# Patient Record
Sex: Female | Born: 1990 | Race: White | Hispanic: No | Marital: Single | Smoking: Never smoker
Health system: Southern US, Community
[De-identification: ages and names within clinical notes are randomized; demographics above are authoritative.]

## PROBLEM LIST (undated history)

## (undated) DIAGNOSIS — R519 Headache, unspecified: Secondary | ICD-10-CM

## (undated) DIAGNOSIS — Z8619 Personal history of other infectious and parasitic diseases: Secondary | ICD-10-CM

## (undated) DIAGNOSIS — R51 Headache: Secondary | ICD-10-CM

## (undated) HISTORY — DX: Personal history of other infectious and parasitic diseases: Z86.19

## (undated) HISTORY — DX: Headache, unspecified: R51.9

## (undated) HISTORY — PX: APPENDECTOMY: SHX54

## (undated) HISTORY — DX: Headache: R51

## (undated) HISTORY — PX: TONSILLECTOMY: SUR1361

---
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
## 2014-10-31 NOTE — L&D Delivery Note (Signed)
Delivery Note Pt reached complete dilation and pushed well for about one hour.  At 7:02 PM a healthy female was delivered via Vaginal, Spontaneous Delivery (Presentation: Left Occiput Anterior).  APGAR: 9, 9; weight  pending .   Placenta status: Intact, Spontaneous.  Cord: 3 vessels with the following complications: None.  Anesthesia: Epidural  Episiotomy: None Lacerations: 2nd degree Suture Repair: 3.0 vicryl rapide Est. Blood Loss (mL):  Mom to postpartum.  Baby to Couplet care / Skin to Skin. Pt plans circ in office  RICHARDSON,KATHY W 07/30/2015, 7:34 PM

## 2015-03-04 LAB — OB RESULTS CONSOLE HIV ANTIBODY (ROUTINE TESTING): HIV: NONREACTIVE

## 2015-03-04 LAB — OB RESULTS CONSOLE RPR: RPR: NONREACTIVE

## 2015-03-04 LAB — OB RESULTS CONSOLE ABO/RH: RH TYPE: POSITIVE

## 2015-03-04 LAB — OB RESULTS CONSOLE GC/CHLAMYDIA
CHLAMYDIA, DNA PROBE: POSITIVE
GC PROBE AMP, GENITAL: NEGATIVE

## 2015-03-04 LAB — OB RESULTS CONSOLE HEPATITIS B SURFACE ANTIGEN: HEP B S AG: NEGATIVE

## 2015-03-04 LAB — OB RESULTS CONSOLE ANTIBODY SCREEN: Antibody Screen: NEGATIVE

## 2015-03-06 LAB — OB RESULTS CONSOLE RUBELLA ANTIBODY, IGM: RUBELLA: IMMUNE

## 2015-04-28 LAB — OB RESULTS CONSOLE GC/CHLAMYDIA: Chlamydia: POSITIVE

## 2015-06-24 LAB — OB RESULTS CONSOLE GC/CHLAMYDIA
CHLAMYDIA, DNA PROBE: NEGATIVE
Gonorrhea: NEGATIVE

## 2015-06-24 LAB — OB RESULTS CONSOLE GBS: STREP GROUP B AG: NEGATIVE

## 2015-07-24 ENCOUNTER — Inpatient Hospital Stay (HOSPITAL_COMMUNITY)
Admission: AD | Admit: 2015-07-24 | Payer: Medicaid Other | Source: Ambulatory Visit | Admitting: Obstetrics and Gynecology

## 2015-07-29 ENCOUNTER — Encounter (HOSPITAL_COMMUNITY): Payer: Self-pay | Admitting: *Deleted

## 2015-07-29 ENCOUNTER — Telehealth (HOSPITAL_COMMUNITY): Payer: Self-pay | Admitting: *Deleted

## 2015-07-29 NOTE — Telephone Encounter (Signed)
Preadmission screen  

## 2015-07-29 NOTE — H&P (Signed)
Valena Ivanov is a 24 y.o. female G1P0 at 74 6/7 weeks (EDD 07/24/15 by 12 week [place]) presenting for IOL.  Prenatal care uncomplicated exceptlate start at 18 weeks.  Also had +chlamydia screen 6/16 and negative TOC 06/24/15 after treatment.  Maternal Medical History:  Contractions: Frequency: irregular.   Perceived severity is mild.    Fetal activity: Perceived fetal activity is normal.    Prenatal Complications - Diabetes: none.    OB History    Gravida Para Term Preterm AB TAB SAB Ectopic Multiple Living   1              Past Medical History  Diagnosis Date  . Hx of varicella   . Headache    Past Surgical History  Procedure Laterality Date  . Tonsillectomy      with adenoids  . Appendectomy     Family History: family history is not on file. Social History:  reports that she has never smoked. She has never used smokeless tobacco. She reports that she does not drink alcohol or use illicit drugs.   Prenatal Transfer Tool  Maternal Diabetes: No Genetic Screening: Declined Maternal Ultrasounds/Referrals: Normal Fetal Ultrasounds or other Referrals:  None Maternal Substance Abuse:  No Significant Maternal Medications:  None Significant Maternal Lab Results:  Lab values include: Other: +chlamydia 6/16, treated with negative TOC 06/24/15 Other Comments:  None  ROS    Last menstrual period 09/30/2014. Maternal Exam:  Uterine Assessment: Contraction strength is mild.  Contraction frequency is irregular.   Abdomen: Patient reports no abdominal tenderness. Fetal presentation: vertex  Introitus: Normal vulva. Normal vagina.    Physical Exam  Constitutional: She appears well-developed and well-nourished.  Cardiovascular: Normal rate and regular rhythm.   Respiratory: Effort normal.  GI: Soft.  Genitourinary: Vagina normal.  Neurological: She is alert.  Psychiatric: She has a normal mood and affect.    Prenatal labs: ABO, Rh: A/Positive/-- (05/04 0000) Antibody: Negative  (05/04 0000) Rubella: Immune (05/06 0000) RPR: Nonreactive (05/04 0000)  HBsAg: Negative (05/04 0000)  HIV: Non-reactive (05/04 0000)  GBS: Negative (08/24 0000)  Declined genetic screens One hour GCT 80 Chlamydia + 6/16 but treated with negative TOC 06/24/15  Assessment/Plan: IOL postterm with favorable cervix 40 6/7 weeks.  Plan pitocin and AROM   RICHARDSON,KATHY W 07/29/2015, 11:00 PM

## 2015-07-30 ENCOUNTER — Inpatient Hospital Stay (HOSPITAL_COMMUNITY): Payer: Medicaid Other | Admitting: Anesthesiology

## 2015-07-30 ENCOUNTER — Encounter (HOSPITAL_COMMUNITY): Payer: Self-pay

## 2015-07-30 ENCOUNTER — Inpatient Hospital Stay (HOSPITAL_COMMUNITY)
Admission: RE | Admit: 2015-07-30 | Discharge: 2015-08-01 | DRG: 775 | Disposition: A | Discharge status: Home / Self Care | Payer: Medicaid Other | Source: Ambulatory Visit | Attending: Obstetrics and Gynecology | Admitting: Obstetrics and Gynecology

## 2015-07-30 DIAGNOSIS — Z3A40 40 weeks gestation of pregnancy: Secondary | ICD-10-CM | POA: Diagnosis not present

## 2015-07-30 DIAGNOSIS — O480 Post-term pregnancy: Secondary | ICD-10-CM | POA: Diagnosis present

## 2015-07-30 LAB — CBC
HCT: 34 % — ABNORMAL LOW (ref 36.0–46.0)
HEMOGLOBIN: 11.1 g/dL — AB (ref 12.0–15.0)
MCH: 29.3 pg (ref 26.0–34.0)
MCHC: 32.6 g/dL (ref 30.0–36.0)
MCV: 89.7 fL (ref 78.0–100.0)
Platelets: 245 10*3/uL (ref 150–400)
RBC: 3.79 MIL/uL — AB (ref 3.87–5.11)
RDW: 15 % (ref 11.5–15.5)
WBC: 11.6 10*3/uL — ABNORMAL HIGH (ref 4.0–10.5)

## 2015-07-30 LAB — TYPE AND SCREEN
ABO/RH(D): A POS
ANTIBODY SCREEN: NEGATIVE

## 2015-07-30 LAB — ABO/RH: ABO/RH(D): A POS

## 2015-07-30 LAB — RPR: RPR: NONREACTIVE

## 2015-07-30 MED ORDER — DIPHENHYDRAMINE HCL 25 MG PO CAPS: 25.0000 mg | ORAL_CAPSULE | Freq: Four times a day (QID) | ORAL | Status: DC | PRN

## 2015-07-30 MED ORDER — DIPHENHYDRAMINE HCL 50 MG/ML IJ SOLN: 12.5000 mg | INTRAMUSCULAR | Status: DC | PRN

## 2015-07-30 MED ORDER — BUPIVACAINE HCL (PF) 0.25 % IJ SOLN
INTRAMUSCULAR | Status: DC | PRN
  Administered 2015-07-30 (×2): 4 mL via EPIDURAL

## 2015-07-30 MED ORDER — OXYTOCIN 40 UNITS IN LACTATED RINGERS INFUSION - SIMPLE MED
62.5000 mL/h | INTRAVENOUS | Status: DC
  Administered 2015-07-30: 500 mL/h via INTRAVENOUS

## 2015-07-30 MED ORDER — PRENATAL MULTIVITAMIN CH
1.0000 | ORAL_TABLET | Freq: Every day | ORAL | Status: DC
  Administered 2015-07-31 – 2015-08-01 (×2): 1 via ORAL
  Filled 2015-07-30 (×2): qty 1

## 2015-07-30 MED ORDER — OXYCODONE-ACETAMINOPHEN 5-325 MG PO TABS: 2.0000 | ORAL_TABLET | ORAL | Status: DC | PRN

## 2015-07-30 MED ORDER — LANOLIN HYDROUS EX OINT: TOPICAL_OINTMENT | CUTANEOUS | Status: DC | PRN

## 2015-07-30 MED ORDER — WITCH HAZEL-GLYCERIN EX PADS: 1.0000 "application " | MEDICATED_PAD | CUTANEOUS | Status: DC | PRN

## 2015-07-30 MED ORDER — TETANUS-DIPHTH-ACELL PERTUSSIS 5-2.5-18.5 LF-MCG/0.5 IM SUSP: 0.5000 mL | Freq: Once | INTRAMUSCULAR | Status: DC

## 2015-07-30 MED ORDER — ONDANSETRON HCL 4 MG/2ML IJ SOLN: 4.0000 mg | INTRAMUSCULAR | Status: DC | PRN

## 2015-07-30 MED ORDER — SIMETHICONE 80 MG PO CHEW: 80.0000 mg | CHEWABLE_TABLET | ORAL | Status: DC | PRN

## 2015-07-30 MED ORDER — ONDANSETRON HCL 4 MG PO TABS: 4.0000 mg | ORAL_TABLET | ORAL | Status: DC | PRN

## 2015-07-30 MED ORDER — ZOLPIDEM TARTRATE 5 MG PO TABS: 5.0000 mg | ORAL_TABLET | Freq: Every evening | ORAL | Status: DC | PRN

## 2015-07-30 MED ORDER — ONDANSETRON HCL 4 MG/2ML IJ SOLN
4.0000 mg | Freq: Four times a day (QID) | INTRAMUSCULAR | Status: DC | PRN
  Administered 2015-07-30: 4 mg via INTRAVENOUS
  Filled 2015-07-30: qty 2

## 2015-07-30 MED ORDER — LACTATED RINGERS IV SOLN
500.0000 mL | INTRAVENOUS | Status: DC | PRN
  Administered 2015-07-30: 1000 mL via INTRAVENOUS

## 2015-07-30 MED ORDER — ACETAMINOPHEN 325 MG PO TABS: 650.0000 mg | ORAL_TABLET | ORAL | Status: DC | PRN

## 2015-07-30 MED ORDER — CITRIC ACID-SODIUM CITRATE 334-500 MG/5ML PO SOLN: 30.0000 mL | ORAL | Status: DC | PRN

## 2015-07-30 MED ORDER — LIDOCAINE HCL (PF) 1 % IJ SOLN
30.0000 mL | INTRAMUSCULAR | Status: DC | PRN
  Filled 2015-07-30: qty 30

## 2015-07-30 MED ORDER — OXYCODONE-ACETAMINOPHEN 5-325 MG PO TABS
1.0000 | ORAL_TABLET | ORAL | Status: DC | PRN
  Administered 2015-07-31: 1 via ORAL
  Filled 2015-07-30: qty 1

## 2015-07-30 MED ORDER — BENZOCAINE-MENTHOL 20-0.5 % EX AERO
1.0000 "application " | INHALATION_SPRAY | CUTANEOUS | Status: DC | PRN
  Administered 2015-07-31: 1 via TOPICAL
  Filled 2015-07-30: qty 56

## 2015-07-30 MED ORDER — EPHEDRINE 5 MG/ML INJ: 10.0000 mg | INTRAVENOUS | Status: DC | PRN

## 2015-07-30 MED ORDER — PHENYLEPHRINE 40 MCG/ML (10ML) SYRINGE FOR IV PUSH (FOR BLOOD PRESSURE SUPPORT)
80.0000 ug | PREFILLED_SYRINGE | INTRAVENOUS | Status: DC | PRN
  Filled 2015-07-30: qty 20

## 2015-07-30 MED ORDER — OXYTOCIN 40 UNITS IN LACTATED RINGERS INFUSION - SIMPLE MED
1.0000 m[IU]/min | INTRAVENOUS | Status: DC
Start: 2015-07-30 — End: 2015-07-30
  Administered 2015-07-30: 2 m[IU]/min via INTRAVENOUS
  Filled 2015-07-30: qty 1000

## 2015-07-30 MED ORDER — TERBUTALINE SULFATE 1 MG/ML IJ SOLN: 0.2500 mg | Freq: Once | INTRAMUSCULAR | Status: DC | PRN

## 2015-07-30 MED ORDER — DIBUCAINE 1 % RE OINT: 1.0000 "application " | TOPICAL_OINTMENT | RECTAL | Status: DC | PRN

## 2015-07-30 MED ORDER — IBUPROFEN 600 MG PO TABS
600.0000 mg | ORAL_TABLET | Freq: Four times a day (QID) | ORAL | Status: DC
  Administered 2015-07-31 – 2015-08-01 (×7): 600 mg via ORAL
  Filled 2015-07-30 (×7): qty 1

## 2015-07-30 MED ORDER — OXYTOCIN BOLUS FROM INFUSION: 500.0000 mL | INTRAVENOUS | Status: DC

## 2015-07-30 MED ORDER — SENNOSIDES-DOCUSATE SODIUM 8.6-50 MG PO TABS
2.0000 | ORAL_TABLET | ORAL | Status: DC
  Administered 2015-07-31 – 2015-08-01 (×2): 2 via ORAL
  Filled 2015-07-30 (×2): qty 2

## 2015-07-30 MED ORDER — OXYCODONE-ACETAMINOPHEN 5-325 MG PO TABS: 1.0000 | ORAL_TABLET | ORAL | Status: DC | PRN

## 2015-07-30 MED ORDER — LIDOCAINE-EPINEPHRINE (PF) 2 %-1:200000 IJ SOLN
INTRAMUSCULAR | Status: DC | PRN
  Administered 2015-07-30: 3 mL

## 2015-07-30 MED ORDER — BUTORPHANOL TARTRATE 1 MG/ML IJ SOLN
1.0000 mg | INTRAMUSCULAR | Status: DC | PRN
  Administered 2015-07-30 (×2): 1 mg via INTRAVENOUS
  Filled 2015-07-30 (×2): qty 1

## 2015-07-30 MED ORDER — FENTANYL 2.5 MCG/ML BUPIVACAINE 1/10 % EPIDURAL INFUSION (WH - ANES)
14.0000 mL/h | INTRAMUSCULAR | Status: DC | PRN
Start: 2015-07-30 — End: 2015-07-30
  Administered 2015-07-30: 12 mL/h via EPIDURAL
  Filled 2015-07-30: qty 125

## 2015-07-30 MED ORDER — LACTATED RINGERS IV SOLN
INTRAVENOUS | Status: DC
  Administered 2015-07-30: 08:00:00 via INTRAVENOUS

## 2015-07-30 NOTE — Progress Notes (Signed)
Patient ID: Janet Schneider, female   DOB: [birth year], 24 y.o.   MRN: [id number] Pt doing well.  On pitocin with mild contractions FHR category 1 Cervix 80/2-3/-2 AROM light meconium  Continue pitocin and follow progress Pt not sure if she will do pain meds or not--just playing it by ear

## 2015-07-30 NOTE — Lactation Note (Signed)
This note was copied from the chart of Janet Schneider. Lactation Consultation Note New mom having difficulty latching at times. Has tubular breast w/everted nipple. Encouraged football hold for a deep latch. Referred to Baby and Me Book in Breastfeeding section Pg. 22-23 for position options and Proper latch demonstration.Mom encouraged to feed baby 8-12 times/24 hours and with feeding cues. Mom encouraged to do skin-to-skin. Encouraged comfort during BF so colostrum flows better and mom will enjoy the feeding longer. Taking deep breaths and breast massage during BF. Educated about newborn behavior, I&O, cluster feeding, supply and demand. Hand expression taught w/colostrum noted. WH/LC brochure given w/resources, support groups and LC services. Needed much teach on holding baby, positioning, and breast massage.  Patient Name: Janet Keera Altidor [id number] Date: 07/30/2015 Reason for consult: Initial assessment   Maternal Data    Feeding Feeding Type: Breast Fed Length of feed: 10 min (still BF)  LATCH Score/Interventions Latch: Repeated attempts needed to sustain latch, nipple held in mouth throughout feeding, stimulation needed to elicit sucking reflex. Intervention(s): Assist with latch;Adjust position;Breast massage;Breast compression  Audible Swallowing: None Intervention(s): Skin to skin;Hand expression  Type of Nipple: Everted at rest and after stimulation  Comfort (Breast/Nipple): Soft / non-tender     Hold (Positioning): Assistance needed to correctly position infant at breast and maintain latch. Intervention(s): Breastfeeding basics reviewed;Support Pillows;Position options;Skin to skin  LATCH Score: 6  Lactation Tools Discussed/Used WIC Program: Yes   Consult Status Consult Status: Follow-up Date: 07/31/15 Follow-up type: In-patient    Charyl Dancer 07/30/2015, 11:38 PM

## 2015-07-30 NOTE — Anesthesia Preprocedure Evaluation (Signed)
Anesthesia Evaluation  Patient identified by MRN, date of birth, ID band Patient awake    Reviewed: Allergy & Precautions, Patient's Chart, lab work & pertinent test results  History of Anesthesia Complications Negative for: history of anesthetic complications  Airway Mallampati: II  TM Distance: >3 FB Neck ROM: Full    Dental  (+) Teeth Intact   Pulmonary neg pulmonary ROS,    breath sounds clear to auscultation       Cardiovascular negative cardio ROS   Rhythm:Regular     Neuro/Psych  Headaches, negative psych ROS   GI/Hepatic negative GI ROS, Neg liver ROS,   Endo/Other  negative endocrine ROS  Renal/GU negative Renal ROS     Musculoskeletal   Abdominal   Peds  Hematology negative hematology ROS (+)   Anesthesia Other Findings   Reproductive/Obstetrics (+) Pregnancy                             Anesthesia Physical Anesthesia Plan  ASA: II  Anesthesia Plan: Epidural   Post-op Pain Management:    Induction:   Airway Management Planned:   Additional Equipment:   Intra-op Plan:   Post-operative Plan:   Informed Consent: I have reviewed the patients History and Physical, chart, labs and discussed the procedure including the risks, benefits and alternatives for the proposed anesthesia with the patient or authorized representative who has indicated his/her understanding and acceptance.   Dental advisory given  Plan Discussed with: Anesthesiologist  Anesthesia Plan Comments:         Anesthesia Quick Evaluation  

## 2015-07-30 NOTE — Anesthesia Procedure Notes (Signed)
Epidural Patient location during procedure: OB  Staffing Anesthesiologist: MOSER, CHRISTOPHER Performed by: anesthesiologist   Preanesthetic Checklist Completed: patient identified, surgical consent, pre-op evaluation, timeout performed, IV checked, risks and benefits discussed and monitors and equipment checked  Epidural Patient position: sitting Prep: DuraPrep Patient monitoring: heart rate, cardiac monitor, continuous pulse ox and blood pressure Approach: midline Location: L3-L4 Injection technique: LOR saline  Needle:  Needle type: Tuohy  Needle gauge: 17 G Needle length: 9 cm Needle insertion depth: 6 cm Catheter type: closed end flexible Catheter size: 19 Gauge Catheter at skin depth: 13 cm Test dose: negative and 2% lidocaine with Epi 1:200 K  Assessment Events: blood not aspirated, injection not painful, no injection resistance, negative IV test and no paresthesia  Additional Notes Reason for block:procedure for pain

## 2015-07-31 LAB — CBC
HEMATOCRIT: 31 % — AB (ref 36.0–46.0)
HEMOGLOBIN: 10 g/dL — AB (ref 12.0–15.0)
MCH: 29 pg (ref 26.0–34.0)
MCHC: 32.3 g/dL (ref 30.0–36.0)
MCV: 89.9 fL (ref 78.0–100.0)
Platelets: 250 10*3/uL (ref 150–400)
RBC: 3.45 MIL/uL — AB (ref 3.87–5.11)
RDW: 15.4 % (ref 11.5–15.5)
WBC: 17.7 10*3/uL — ABNORMAL HIGH (ref 4.0–10.5)

## 2015-07-31 LAB — CCBB MATERNAL DONOR DRAW

## 2015-07-31 NOTE — Lactation Note (Signed)
This note was copied from the chart of Janet Schneider. Lactation Consultation Note; Mom reports that baby has been doing better at latching. Several visitors present and baby is asleep. Encouraged to call RN at the next feeding. to observe a latch score. No questions at present. To call prn  Patient Name: Janet Schneider [id number] Date: 07/31/2015 Reason for consult: Follow-up assessment   Maternal Data Formula Feeding for Exclusion: No Does the patient have breastfeeding experience prior to this delivery?: No  Feeding   LATCH Score/Interventions                      Lactation Tools Discussed/Used     Consult Status Consult Status: Follow-up Date: 08/01/15 Follow-up type: In-patient    Pamelia Hoit 07/31/2015, 4:49 PM

## 2015-07-31 NOTE — Anesthesia Postprocedure Evaluation (Signed)
Anesthesia Post Note  Patient: Janet Schneider  Procedure(s) Performed: * No procedures listed *  Anesthesia type: Epidural  Patient location: Mother/Baby  Post pain: Pain level controlled  Post assessment: Post-op Vital signs reviewed  Last Vitals:  Filed Vitals:   07/31/15 0530  BP: 135/76  Pulse: 99  Temp: 37 C  Resp: 18    Post vital signs: Reviewed  Level of consciousness: awake  Complications: No apparent anesthesia complications

## 2015-07-31 NOTE — Progress Notes (Signed)
UR chart review completed.  

## 2015-07-31 NOTE — Progress Notes (Signed)
Post Partum Day 1 Subjective: no complaints and tolerating PO  Objective: Blood pressure 135/76, pulse 99, temperature 98.6 F (37 C), temperature source Oral, resp. rate 18, height  (1.651 m), weight 95.255 kg (210 lb), last menstrual period 09/30/2014, unknown if currently breastfeeding.  Physical Exam:  General: alert and cooperative Lochia: appropriate Uterine Fundus: firm   Recent Labs  07/30/15 0735 07/31/15 0600  HGB 11.1* 10.0*  HCT 34.0* 31.0*    Assessment/Plan: Plan for discharge tomorrow   LOS: 1 day   Janet Schneider W 07/31/2015, 9:08 AM

## 2015-08-01 MED ORDER — PRENATAL 27-0.8 MG PO TABS: 1.0000 | ORAL_TABLET | Freq: Every day | ORAL | Status: DC

## 2015-08-01 MED ORDER — IBUPROFEN 800 MG PO TABS: 800.0000 mg | ORAL_TABLET | Freq: Three times a day (TID) | ORAL | Status: DC | PRN

## 2015-08-01 MED ORDER — OXYCODONE-ACETAMINOPHEN 5-325 MG PO TABS: 1.0000 | ORAL_TABLET | Freq: Four times a day (QID) | ORAL | Status: DC | PRN

## 2015-08-01 NOTE — Lactation Note (Signed)
This note was copied from the chart of Janet Schneider. Lactation Consultation Note New mom states baby is BF well. She has BF in several different positions. Encouraged mom to use pillows or something to prop on for relaxation, so colostrum and let down will flow. Mom has good everted nipples. Explained cluster feeding, supply and demand. Has WIC will f/u appt. All ready made. Reviewed engorgement, and I&O. Patient Name: Janet Schneider [id number] Date: 08/01/2015 Reason for consult: Follow-up assessment   Maternal Data    Feeding Feeding Type: Breast Fed Length of feed: 20 min  LATCH Score/Interventions Latch: Grasps breast easily, tongue down, lips flanged, rhythmical sucking. Intervention(s): Skin to skin Intervention(s): Adjust position  Audible Swallowing: Spontaneous and intermittent Intervention(s): Hand expression;Skin to skin  Type of Nipple: Everted at rest and after stimulation  Comfort (Breast/Nipple): Filling, red/small blisters or bruises, mild/mod discomfort  Interventions (Mild/moderate discomfort): Comfort gels;Post-pump;Hand massage;Hand expression  Hold (Positioning): Assistance needed to correctly position infant at breast and maintain latch. Intervention(s): Support Pillows;Position options;Breastfeeding basics reviewed;Skin to skin  LATCH Score: 8  Lactation Tools Discussed/Used     Consult Status Consult Status: Complete Date: 08/01/15    Charyl Dancer 08/01/2015, 1:31 PM

## 2015-08-01 NOTE — Progress Notes (Signed)
Post Partum Day 2 Subjective: no complaints, up ad lib, voiding, tolerating PO and nl lochia, pain controlled  Objective: Blood pressure 117/61, pulse 94, temperature 98.8 F (37.1 C), temperature source Oral, resp. rate 18, height  (1.651 m), weight 95.255 kg (210 lb), last menstrual period 09/30/2014, unknown if currently breastfeeding.  Physical Exam:  General: alert and no distress Lochia: appropriate Uterine Fundus: firm   Recent Labs  07/30/15 0735 07/31/15 0600  HGB 11.1* 10.0*  HCT 34.0* 31.0*    Assessment/Plan: Discharge home, Breastfeeding and Lactation consult.  D/c with motrin, percocet and PNV.  F/u 6 weeks   LOS: 2 days   Schneider, Janet Escareno 08/01/2015, 7:19 AM

## 2015-08-04 NOTE — Discharge Summary (Signed)
Obstetric Discharge Summary Reason for Admission: induction of labor Prenatal Procedures: none Intrapartum Procedures: spontaneous vaginal delivery Postpartum Procedures: none Complications-Operative and Postpartum: 2nd  degree perineal laceration HEMOGLOBIN  Date Value Ref Range Status  07/31/2015 10.0* 12.0 - 15.0 g/dL Final   HCT  Date Value Ref Range Status  07/31/2015 31.0* 36.0 - 46.0 % Final    Physical Exam:  General: alert and no distress Lochia: appropriate Uterine Fundus: firm   Discharge Diagnoses: Term Pregnancy-delivered  Discharge Information: Date: 08/01/2015 Activity: pelvic rest Diet: routine Medications: PNV, Ibuprofen and Percocet Condition: stable Instructions: refer to practice specific booklet Discharge to: home Follow-up Information    Follow up with Oliver Pila, MD. Schedule an appointment as soon as possible for a visit in 6 weeks.   Specialty:  Obstetrics and Gynecology   Why:  postpartum exam   Contact information:   [address] [place] [address] [phone]       Newborn Data: Live born female  Birth Weight: 8 lb 3.8 oz (3737 g) APGAR: 9, 9  Home with mother.  Janet Schneider, Janet Schneider 08/04/2015, 8:06 PM

## 2021-11-04 ENCOUNTER — Encounter (HOSPITAL_COMMUNITY): Payer: Self-pay | Admitting: Student

## 2021-11-04 ENCOUNTER — Ambulatory Visit (HOSPITAL_COMMUNITY)
Admission: EM | Admit: 2021-11-04 | Discharge: 2021-11-05 | Disposition: A | Discharge status: Other Acute Inpatient Hospital | Payer: BC Managed Care – PPO | Attending: Student | Admitting: Student

## 2021-11-04 DIAGNOSIS — Z9151 Personal history of suicidal behavior: Secondary | ICD-10-CM | POA: Diagnosis not present

## 2021-11-04 DIAGNOSIS — F322 Major depressive disorder, single episode, severe without psychotic features: Secondary | ICD-10-CM | POA: Diagnosis not present

## 2021-11-04 DIAGNOSIS — Z5986 Financial insecurity: Secondary | ICD-10-CM | POA: Diagnosis not present

## 2021-11-04 DIAGNOSIS — Z630 Problems in relationship with spouse or partner: Secondary | ICD-10-CM | POA: Diagnosis not present

## 2021-11-04 DIAGNOSIS — Z20822 Contact with and (suspected) exposure to covid-19: Secondary | ICD-10-CM | POA: Diagnosis not present

## 2021-11-04 DIAGNOSIS — F419 Anxiety disorder, unspecified: Secondary | ICD-10-CM | POA: Diagnosis not present

## 2021-11-04 DIAGNOSIS — R45851 Suicidal ideations: Secondary | ICD-10-CM | POA: Insufficient documentation

## 2021-11-04 DIAGNOSIS — F4320 Adjustment disorder, unspecified: Secondary | ICD-10-CM | POA: Insufficient documentation

## 2021-11-04 LAB — POC SARS CORONAVIRUS 2 AG -  ED: SARS Coronavirus 2 Ag: NEGATIVE

## 2021-11-04 LAB — POCT URINE DRUG SCREEN - MANUAL ENTRY (I-SCREEN)
POC Amphetamine UR: NOT DETECTED
POC Buprenorphine (BUP): NOT DETECTED
POC Cocaine UR: NOT DETECTED
POC Marijuana UR: NOT DETECTED
POC Methadone UR: NOT DETECTED
POC Methamphetamine UR: NOT DETECTED
POC Morphine: NOT DETECTED
POC Oxazepam (BZO): NOT DETECTED
POC Oxycodone UR: NOT DETECTED
POC Secobarbital (BAR): NOT DETECTED

## 2021-11-04 MED ORDER — ALUM & MAG HYDROXIDE-SIMETH 200-200-20 MG/5ML PO SUSP: 30.0000 mL | ORAL | Status: DC | PRN

## 2021-11-04 MED ORDER — HYDROXYZINE HCL 25 MG PO TABS
25.0000 mg | ORAL_TABLET | Freq: Three times a day (TID) | ORAL | Status: DC | PRN
  Administered 2021-11-05: 25 mg via ORAL
  Filled 2021-11-04: qty 1

## 2021-11-04 MED ORDER — ACETAMINOPHEN 325 MG PO TABS: 650.0000 mg | ORAL_TABLET | Freq: Four times a day (QID) | ORAL | Status: DC | PRN

## 2021-11-04 MED ORDER — MAGNESIUM HYDROXIDE 400 MG/5ML PO SUSP: 30.0000 mL | Freq: Every day | ORAL | Status: DC | PRN

## 2021-11-04 MED ORDER — TRAZODONE HCL 50 MG PO TABS
50.0000 mg | ORAL_TABLET | Freq: Every evening | ORAL | Status: DC | PRN
  Administered 2021-11-04: 50 mg via ORAL
  Filled 2021-11-04: qty 1

## 2021-11-04 NOTE — ED Provider Notes (Signed)
Behavioral Health Admission H&P [facility] & OBS)  Date: 11/05/21 Patient Name: Joiya Potenza MRN: [id number] Chief Complaint:  Chief Complaint  Patient presents with   Adjustment Disorder    Suicidal thoughts      Diagnoses:  Final diagnoses:  MDD (major depressive disorder), single episode, severe , no psychosis ([place])    HPI: Maaria Conners is a 31 year old female with no significant past psychiatric or medical history who presents to the [facility] behavioral health urgent care via [place] [facility] as a voluntary walk-in for symptoms of worsening depression and SI, as well as concerns regarding her current status as a parent.  Patient's mother called police due to concerns about the patient's mental health.  Patient states that her and her boyfriend broke up earlier today on 11/04/2021 and that she has been feeling extremely depressed since this occurred.  When patient is asked how long she was dating this boyfriend, patient states "not long" and does not provide further details.  Patient also states "I'm struggling with not wanting to be in this world and not wanting to be a parent".  Patient reports that she has been having these thoughts of not wanting to be a parent since her child was 50-year-old (patient's child is currently 44 years old).  She states that her boyfriend that she recently broke up with earlier today was not the father of her child.    Patient denies SI currently on exam.  She does endorse having SI earlier today on 11/04/2021 without any suicidal plan or intent.  Patient denies history of any past suicide attempts and patient verbally contracts for safety with this Probation officer. However, it is documented in TTS evaluation that patient told TTS counselor that she attempted suicide via overdose in the past by trying to overdose on migraine medication.  Additionally, per 11/04/2021 TTS evaluation note (as documented in [place], [place] 11/04/21 TTS note): "Pt is worried about her state of mind and  wonders if she is a good parent "sometimes I wonder if I want to be a parent at all".  Pts child is 41 years old. Pt stated to her mother that she isn't sure she should be around her son and stating she is scared by her own thoughts.  Pt [place] unable to contract for safety "I don't know if Im a danger to myself or not".  Additionally, reports that this provided by TTS counselor that the patient disclosed to TTS counselor during TTS evaluation that the patient has wished that she had an abortion in the past.  When patient is asked if she would like to provide any further details regarding her feelings of parenthood and her child, patient states "there is a lot more to talk about", but she states that she does not want to discuss these details at this time due to being tired.  Patient states that other stressors contributing to her depressive symptoms include financial stress.  Patient denies history of self-injurious behavior via intentionally cutting or burning herself.  She denies HI.  She denies auditory hallucinations or visual hallucinations.  She denies paranoia.  Patient describes her sleep as "fine" and states that she sleeps about 7 hours per night.  She endorses difficulties with waking up frequently while she is sleeping.  She denies nightmares.  Patient endorses anhedonia and feelings of guilt, hopelessness, and worthlessness.  Patient states that she is unsure if she has had any energy changes recently.  She endorses decline in concentration recently.  She denies  appetite or weight changes.  Patient states that she does not have a psychiatrist or therapist at this time, but patient states that she probably should begin seeing a psychiatrist and therapist.  Patient denies ever seeing a psychiatrist and therapist in the past.  Patient denies history of ever taking psychotropic medications in the past and she denies taking any home medications currently.  Patient denies history of any past inpatient  psychiatric hospitalizations.  Patient currently lives in [place] with her 61-year-old son.  Patient denies access to firearms.  Patient endorses drinking alcohol less than 1 time per week but she does not provide details regarding quantity of alcohol use.  She denies tobacco/nicotine or illicit substance use.  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  On exam, patient is sitting upright with disheveled appearance, in no acute distress.  Eye contact is good.  Speech is clear and coherent with normal rate and decreased volume.  Mood is depressed with congruent, tearful affect.  Thought process is coherent, goal directed, and linear.  Patient is alert and oriented x4, cooperative, and answers all questions asked during the evaluation.  No indication of patient is responding to internal/external stimuli.  No delusional thought content noted on exam.  With patient's consent, TTS counselor (Christina Hussami, LCSW) obtained collateral information by speaking with patient's mother.  That collateral information that was obtained is shown below (as documented in [place] 11/04/2021 TTS note):   "Collateral:  Pts mother:  [name] [phone] .  Pts mother reports that today pts son's father reached out to Amy with concerns about Shylo. Karelyn sent him text messages alluding suicidal ideation.  Namari also sent her sister alarming texts containing pts thoughts about suicidal ideation. Amy reports that Zonya stated to her "I think I need to be in the hospital" and that "I don't think I need to be around [place] (pts 86 yo son) I'm scared".  Amy reports that she is concerned that Jamirah could do something. Amy reports that Vy stated to her "I don't know what I might do" Amy feels that Mylea has had some symptoms on and off for over a year and has not had any outpatient psychiatric supports "she should have a long time ago".   PHQ 2-9:   [place] ED  from 11/04/2021 in [place] CATEGORY Low Risk        Total Time spent with patient: 30 minutes  Musculoskeletal  Strength & Muscle Tone: within normal limits Gait & Station: normal Patient leans: N/A  Psychiatric Specialty Exam  Presentation General Appearance: Appropriate for Environment; Disheveled  Eye Contact:Good  Speech:Clear and Coherent; Normal Rate  Speech Volume:Decreased  Handedness:No data recorded  Mood and Affect  Mood:Depressed  Affect:Congruent; Tearful   Thought Process  Thought Processes:Coherent; Goal Directed; Linear  Descriptions of Associations:Intact  Orientation:Full (Time, Place and Person)  Thought Content:Logical; WDL    Hallucinations:Hallucinations: None  Ideas of Reference:None  Suicidal Thoughts:Suicidal Thoughts: -- (Patient denies SI currently on exam, but does endorse experiencing SI without plan earlier today (see HPI for details).)  Homicidal Thoughts:Homicidal Thoughts: No   Sensorium  Memory:Immediate Good; Recent Good; Remote Good  Judgment:Good  Insight:Good   Executive Functions  Concentration:Good  Attention Span:Good  [place] of Knowledge:Good  Language:Good   Psychomotor Activity  Psychomotor Activity:Psychomotor Activity: Normal   Assets  Assets:Communication Skills; Desire for Improvement; Housing;  Leisure Time; Physical Health; Resilience; Museum/gallery exhibitions officer; Vocational/Educational   Sleep  Sleep:Sleep: Fair Number of Hours of Sleep: 7   Nutritional Assessment (For OBS and FBC admissions only) Has the patient had a weight loss or gain of 10 pounds or more in the last 3 months?: No Has the patient had a decrease in food intake/or appetite?: No Does the patient have dental problems?: No Does the patient have eating habits or behaviors that may be indicators of an eating disorder including binging or inducing vomiting?:  No Has the patient recently lost weight without trying?: 0 Has the patient been eating poorly because of a decreased appetite?: 0 Malnutrition Screening Tool Score: 0    Physical Exam Vitals reviewed.  Constitutional:      General: She is not in acute distress.    Appearance: She is not ill-appearing, toxic-appearing or diaphoretic.  HENT:     Head: Normocephalic and atraumatic.     Right Ear: External ear normal.     Left Ear: External ear normal.     Nose: Nose normal.  Eyes:     General:        Right eye: No discharge.        Left eye: No discharge.     Conjunctiva/sclera: Conjunctivae normal.     Comments: Patient wears glasses.  Cardiovascular:     Rate and Rhythm: Normal rate.  Pulmonary:     Effort: Pulmonary effort is normal. No respiratory distress.  Musculoskeletal:        General: Normal range of motion.     Cervical back: Normal range of motion.  Neurological:     General: No focal deficit present.     Mental Status: She is alert and oriented to person, place, and time.     Comments: No tremor noted.  Psychiatric:        Attention and Perception: Attention and perception normal. She does not perceive auditory or visual hallucinations.        Mood and Affect: Mood is depressed.        Speech: Speech normal.        Behavior: Behavior is withdrawn. Behavior is not agitated, slowed, aggressive, hyperactive or combative. Behavior is cooperative.        Thought Content: Thought content is not paranoid or delusional. Thought content does not include homicidal ideation.     Comments: Affect mood congruent and tearful. Patient denies SI currently on exam, but does endorse experiencing SI without plan earlier today (see HPI for details).    Review of Systems  Constitutional:  Negative for chills, diaphoresis, fever, malaise/fatigue and weight loss.  HENT:  Negative for congestion.   Respiratory:  Negative for cough and shortness of breath.   Cardiovascular:  Negative  for chest pain and palpitations.  Gastrointestinal:  Negative for abdominal pain, constipation, diarrhea, nausea and vomiting.  Musculoskeletal:  Negative for joint pain and myalgias.  Neurological:  Negative for dizziness, seizures and headaches.  Psychiatric/Behavioral:  Positive for depression and suicidal ideas. Negative for hallucinations, memory loss and substance abuse.   All other systems reviewed and are negative.  Vitals: Blood pressure 100/74, pulse 75, temperature 98.5 F (36.9 C), temperature source Oral, resp. rate 14, SpO2 97 %, unknown if currently breastfeeding. There is no height or weight on file to calculate BMI.  Past Psychiatric History: No significant past psychiatric history.  See HPI for further details regarding past psychiatric history.  Is the patient at risk to self?  Yes  Has the patient been a risk to self in the past 6 months? No .    Has the patient been a risk to self within the distant past? Yes   Is the patient a risk to others? Yes   Has the patient been a risk to others in the past 6 months? No   Has the patient been a risk to others within the distant past? No   Past Medical History:  Past Medical History:  Diagnosis Date   Headache    Hx of varicella     Past Surgical History:  Procedure Laterality Date   APPENDECTOMY     TONSILLECTOMY     with adenoids    Family History: History reviewed. No pertinent family history.  Social History:  Social History   Socioeconomic History   Marital status: Single    Spouse name: Not on file   Number of children: Not on file   Years of education: Not on file   Highest education level: Not on file  Occupational History   Not on file  Tobacco Use   Smoking status: Never   Smokeless tobacco: Never  Substance and Sexual Activity   Alcohol use: No   Drug use: No   Sexual activity: Yes  Other Topics Concern   Not on file  Social History Narrative   Not on file   Social Determinants of Health    Financial Resource Strain: Not on file  Food Insecurity: Not on file  Transportation Needs: Not on file  Physical Activity: Not on file  Stress: Not on file  Social Connections: Not on file  Intimate Partner Violence: Not on file    SDOH:  SDOH Screenings   Alcohol Screen: Not on file  Depression (PHQ2-9): Not on file  Financial Resource Strain: Not on file  Food Insecurity: Not on file  Housing: Not on file  Physical Activity: Not on file  Social Connections: Not on file  Stress: Not on file  Tobacco Use: Low Risk    Smoking Tobacco Use: Never   Smokeless Tobacco Use: Never   Passive Exposure: Not on file  Transportation Needs: Not on file    Last Labs:  Admission on 11/04/2021  Component Date Value Ref Range Status   SARS Coronavirus 2 by RT PCR 11/04/2021 NEGATIVE  NEGATIVE Final   Comment: (NOTE) SARS-CoV-2 target nucleic acids are NOT DETECTED.  The SARS-CoV-2 RNA is generally detectable in upper respiratory specimens during the acute phase of infection. The lowest concentration of SARS-CoV-2 viral copies this assay can detect is 138 copies/mL. A negative result does not preclude SARS-Cov-2 infection and should not be used as the sole basis for treatment or other patient management decisions. A negative result may occur with  improper specimen collection/handling, submission of specimen other than nasopharyngeal swab, presence of viral mutation(s) within the areas targeted by this assay, and inadequate number of viral copies(<138 copies/mL). A negative result must be combined with clinical observations, patient history, and epidemiological information. The expected result is Negative.  Fact Sheet for Patients:  EntrepreneurPulse.com.au  Fact Sheet for Healthcare Providers:  IncredibleEmployment.be  This test is no                          t yet approved or cleared by the [place] FDA and  has been authorized for  detection and/or diagnosis of SARS-CoV-2 by FDA under an Emergency Use Authorization (EUA). This EUA  will remain  in effect (meaning this test can be used) for the duration of the COVID-19 declaration under Section 564(b)(1) of the Act, 21 U.S.C.section 360bbb-3(b)(1), unless the authorization is terminated  or revoked sooner.       Influenza A by PCR 11/04/2021 NEGATIVE  NEGATIVE Final   Influenza B by PCR 11/04/2021 NEGATIVE  NEGATIVE Final   Comment: (NOTE) The Xpert Xpress SARS-CoV-2/FLU/RSV plus assay is intended as an aid in the diagnosis of influenza from Nasopharyngeal swab specimens and should not be used as a sole basis for treatment. Nasal washings and aspirates are unacceptable for Xpert Xpress SARS-CoV-2/FLU/RSV testing.  Fact Sheet for Patients: EntrepreneurPulse.com.au  Fact Sheet for Healthcare Providers: IncredibleEmployment.be  This test is not yet approved or cleared by the [place] FDA and has been authorized for detection and/or diagnosis of SARS-CoV-2 by FDA under an Emergency Use Authorization (EUA). This EUA will remain in effect (meaning this test can be used) for the duration of the COVID-19 declaration under Section 564(b)(1) of the Act, 21 U.S.C. section 360bbb-3(b)(1), unless the authorization is terminated or revoked.  Performed at [facility], [place] [address], [place], [place] [address]    SARS Coronavirus 2 Ag 11/04/2021 Negative  Negative Preliminary   WBC 11/04/2021 8.1  4.0 - 10.5 K/uL Final   RBC 11/04/2021 4.57  3.87 - 5.11 MIL/uL Final   Hemoglobin 11/04/2021 14.4  12.0 - 15.0 g/dL Final   HCT 11/04/2021 43.4  36.0 - 46.0 % Final   MCV 11/04/2021 95.0  80.0 - 100.0 fL Final   MCH 11/04/2021 31.5  26.0 - 34.0 pg Final   MCHC 11/04/2021 33.2  30.0 - 36.0 g/dL Final   RDW 11/04/2021 11.9  11.5 - 15.5 % Final   Platelets 11/04/2021 388  150 - 400 K/uL Final   nRBC 11/04/2021 0.0  0.0 - 0.2 %  Final   Neutrophils Relative % 11/04/2021 70  % Final   Neutro Abs 11/04/2021 5.7  1.7 - 7.7 K/uL Final   Lymphocytes Relative 11/04/2021 22  % Final   Lymphs Abs 11/04/2021 1.8  0.7 - 4.0 K/uL Final   Monocytes Relative 11/04/2021 6  % Final   Monocytes Absolute 11/04/2021 0.5  0.1 - 1.0 K/uL Final   Eosinophils Relative 11/04/2021 1  % Final   Eosinophils Absolute 11/04/2021 0.1  0.0 - 0.5 K/uL Final   Basophils Relative 11/04/2021 1  % Final   Basophils Absolute 11/04/2021 0.1  0.0 - 0.1 K/uL Final   Immature Granulocytes 11/04/2021 0  % Final   Abs Immature Granulocytes 11/04/2021 0.03  0.00 - 0.07 K/uL Final   Performed at [facility], [place] [address] [place], [place] [address]   Sodium 11/04/2021 137  135 - 145 mmol/L Final   Potassium 11/04/2021 3.8  3.5 - 5.1 mmol/L Final   Chloride 11/04/2021 103  98 - 111 mmol/L Final   CO2 11/04/2021 28  22 - 32 mmol/L Final   Glucose, Bld 11/04/2021 86  70 - 99 mg/dL Final   Glucose reference range applies only to samples taken after fasting for at least 8 hours.   BUN 11/04/2021 8  6 - 20 mg/dL Final   Creatinine, Ser 11/04/2021 0.63  0.44 - 1.00 mg/dL Final   Calcium 11/04/2021 9.6  8.9 - 10.3 mg/dL Final   Total Protein 11/04/2021 7.1  6.5 - 8.1 g/dL Final   Albumin 11/04/2021 4.2  3.5 - 5.0 g/dL Final  AST 11/04/2021 19  15 - 41 U/L Final   ALT 11/04/2021 21  0 - 44 U/L Final   Alkaline Phosphatase 11/04/2021 65  38 - 126 U/L Final   Total Bilirubin 11/04/2021 0.3  0.3 - 1.2 mg/dL Final   GFR, Estimated 11/04/2021 >60  >60 mL/min Final   Comment: (NOTE) Calculated using the CKD-EPI Creatinine Equation (2021)    Anion gap 11/04/2021 6  5 - 15 Final   Performed at [facility], [place] [address], [place], [place] [address]   TSH 11/04/2021 2.019  0.350 - 4.500 uIU/mL Final   Comment: Performed by a 3rd Generation assay with a functional sensitivity of <=0.01 uIU/mL. Performed at [facility], [place] [address],  [place], [place] [address]    Hgb A1c MFr Bld 11/04/2021 4.9  4.8 - 5.6 % Final   Comment: (NOTE) Pre diabetes:          5.7%-6.4%  Diabetes:              >6.4%  Glycemic control for   <7.0% adults with diabetes    Mean Plasma Glucose 11/04/2021 93.93  mg/dL Final   Performed at [facility], [place] [address] [place], [place] [address]   POC Amphetamine UR 11/04/2021 None Detected  NONE DETECTED (Cut Off Level 1000 ng/mL) Final   POC Secobarbital (BAR) 11/04/2021 None Detected  NONE DETECTED (Cut Off Level 300 ng/mL) Final   POC Buprenorphine (BUP) 11/04/2021 None Detected  NONE DETECTED (Cut Off Level 10 ng/mL) Final   POC Oxazepam (BZO) 11/04/2021 None Detected  NONE DETECTED (Cut Off Level 300 ng/mL) Final   POC Cocaine UR 11/04/2021 None Detected  NONE DETECTED (Cut Off Level 300 ng/mL) Final   POC Methamphetamine UR 11/04/2021 None Detected  NONE DETECTED (Cut Off Level 1000 ng/mL) Final   POC Morphine 11/04/2021 None Detected  NONE DETECTED (Cut Off Level 300 ng/mL) Final   POC Oxycodone UR 11/04/2021 None Detected  NONE DETECTED (Cut Off Level 100 ng/mL) Final   POC Methadone UR 11/04/2021 None Detected  NONE DETECTED (Cut Off Level 300 ng/mL) Final   POC Marijuana UR 11/04/2021 None Detected  NONE DETECTED (Cut Off Level 50 ng/mL) Final   Alcohol, Ethyl (B) 11/04/2021 <10  <10 mg/dL Final   Comment: (NOTE) Lowest detectable limit for serum alcohol is 10 mg/dL.  For medical purposes only. Performed at [facility], [place] [address] [place], [place] [address]    Cholesterol 11/04/2021 171  0 - 200 mg/dL Final   Triglycerides 11/04/2021 44  <150 mg/dL Final   HDL 11/04/2021 39 (L)  >40 mg/dL Final   Total CHOL/HDL Ratio 11/04/2021 4.4  RATIO Final   VLDL 11/04/2021 9  0 - 40 mg/dL Final   LDL Cholesterol 11/04/2021 123 (H)  0 - 99 mg/dL Final   Comment:        Total Cholesterol/HDL:CHD Risk Coronary Heart Disease Risk Table                     Men   Women  1/2  Average Risk   3.4   3.3  Average Risk       5.0   4.4  2 X Average Risk   9.6   7.1  3 X Average Risk  23.4   11.0        Use the calculated Patient Ratio above and the CHD Risk Table to determine the  patient's CHD Risk.        ATP III CLASSIFICATION (LDL):  <100     mg/dL   Optimal  100-129  mg/dL   Near or Above                    Optimal  130-159  mg/dL   Borderline  160-189  mg/dL   High  >190     mg/dL   Very High Performed at [place] [address], [place], [place] [address]     Allergies: Patient has no known allergies.  PTA Medications: (Not in a hospital admission)   Medical Decision Making  Patient is a 31 year old female with past psychiatric and medical history as stated above who presents to the [facility] voluntarily via law enforcement for worsening depression and SI, as well as concerns regarding her current status as a parent (see HPI for details).  Based on patient's current presentation, my evaluation, and collateral information obtained from patient's mother, the patient appears to be a potential danger to herself and others at this time and thus, recommend continuous assessment for the patient this time.    Recommendations  Based on my evaluation the patient does not appear to have an emergency medical condition.  Patient is agreeable to continuous assessment admission.  Patient will be admitted to [facility] continuous assessment for further crisis stabilization and treatment.  Patient will be reevaluated by the treatment team on 11/05/2021 and disposition to be determined at that time.  Labs ordered and reviewed:  -PCR Flu A&B, COVID: Negative  -UDS: Negative for all substances  -Urine pregnancy: It appears that there was an issue with urine pregnancy test being done. New order placed for urine pregnancy test to be conducted.  -CBC with differential: Within normal limits  -CMP: Within normal limits  -Ethanol: Within normal limits/less than 10  mg/dL  -Hemoglobin A1c: Within normal limits at 4.9%  -TSH: Within normal limits at 2.019 uIU/mL  -Lipid panel: HDL slightly reduced at 39 mg/dL (essentially normal), LDL cholesterol slightly elevated at 123 mg/dL.  Lipid panel otherwise unremarkable.  Patient not taking any home medications and has never taken psychotropic medications in the past.  Discussed with the patient initiating Vistaril 3 times daily as needed for anxiety and trazodone at bedtime as needed for sleep.  Patient educated on side effect profiles of Vistaril and trazodone.  After discussion, patient agreed to initiate Vistaril 3 times daily as needed for anxiety and trazodone at bedtime as needed for sleep.  Orders placed to initiate the following psychotropic medications:  -Vistaril 25 mg p.o. 3 times daily as needed for anxiety  -Trazodone 50 mg p.o. at bedtime as needed for sleep  Patient states that she would prefer not to initiate scheduled psychotropic medications at this time.  Thus no scheduled psychotropic medications initiated at this time.  Additional as needed medications ordered:  -Tylenol 650 mg p.o. every 6 hours as needed for mild pain  -Maalox/Mylanta 30 mL p.o. every 4 hours as needed for indigestion  -Milk of Magnesia 30 mL p.o. daily as needed for mild constipation  Prescilla Sours, PA-C 11/05/21  7:52 AM

## 2021-11-04 NOTE — BH Assessment (Addendum)
Comprehensive Clinical Assessment (CCA) Note  11/04/2021 Janet Schneider [id number]  Chief Complaint:  Chief Complaint  Patient presents with   Adjustment Disorder    Suicidal thoughts   Visit Diagnosis:  MDD, single episode, severe Suicidal ideation   Disposition: Per Margorie John PA--recommending overnight observation [place] for stabilization and safety with psychiatric reassessment in the AM.  [facility] from 11/04/2021 in [place]     The patient demonstrates the following risk factors for suicide: Chronic risk factors for suicide include: psychiatric disorder of depression . Acute risk factors for suicide include: family or marital conflict and loss (financial, interpersonal, professional). Protective factors for this patient include: responsibility to others (children, family). Considering these factors, the overall suicide risk at this point appears to be low. Patient is not appropriate for outpatient follow up.    Janet Schneider is a 31 yo female transported to [facility] by [employer].  Pts mother called [employer] after concerns about her daughter's well being.  Pt reports that her partner broke up with her today, and she has "not been well" since hearing this news. Pt reports that she is having suicidal thoughts with no plans or intent to follow through. "I don't have any means to kill myself at my house". Pt reports that she does live alone. Pt denies any HI, AVH, or substance use.  Pt denies any current or past psychiatric treatment or counseling. Pt reports that she has had suicidal ideation in the past and attempted an overdose once by taking too many migraine pills. Pt is worried about her state of mind and wonders if she is a good parent "sometimes I wonder if I want to be a parent at all".  Pts child is 32 years old. Pt stated to her mother that she isn't sure she should be around her son and stating she is scared by her own thoughts.  Pt [place]  unable to contract for safety "I don't know if Im a danger to myself or not".  Collateral:  Pts mother:  Janet Schneider [phone] .  Pts mother reports that today pts son's father reached out to Amy with concerns about Fronie. Dayannara sent him text messages alluding suicidal ideation.  Daniyah also sent her sister alarming texts containing pts thoughts about suicidal ideation. Amy reports that Lourdes stated to her "I think I need to be in the hospital" and that "I don't think I need to be around Maxwell (pts 42 yo son) I'm scared".  Amy reports that she is concerned that Anayra could do something. Amy reports that Kerra stated to her "I don't know what I might do" Amy feels that Rodneisha has had some symptoms on and off for over a year and has not had any outpatient psychiatric supports "she should have a long time ago".   CCA Screening, Triage and Referral (STR)  Patient Reported Information How did you hear about [place]? Legal System  What Is the Reason for Your Visit/Call Today? Janet Schneider is a 31 yo female transported to [facility] by [employer].  Pts mother called [employer] after concerns about her daughter's well being.  Pt reports that her partner broke up with her today, and she has "not been well" since hearing this news. Pt reports that she is having suicidal thoughts with no plans or intent to follow through. "I don't have any means to kill myself at my house". Pt reports that she does live alone. Pt denies any HI,  AVH, or substance use.  Pt denies any current or past psychiatric treatment or counseling. Pt reports that she has had suicidal ideation in the past. Pt is worried about her state of mind and wonders if she is a good parent "sometimes I wonder if I want to be a parent at all".  Pts child is 9 years old.  Pt [place] unable to contract for safety "I don't know if Im a danger to myself or not".  How Long Has This Been Causing You Problems? <Week  What Do You Feel Would Help You the Most Today? Treatment for Depression or other mood  problem   Have You Recently Had Any Thoughts About Hurting Yourself? Yes  Are You Planning to Commit Suicide/Harm Yourself At This time? No   Have you Recently Had Thoughts About Rea? No  Are You Planning to Harm Someone at This Time? No  Explanation: No data recorded  Have You Used Any Alcohol or Drugs in the Past 24 Hours? No  How Long Ago Did You Use Drugs or Alcohol? No data recorded What Did You Use and How Much? No data recorded  Do You Currently Have a Therapist/Psychiatrist? No  Name of Therapist/Psychiatrist: No data recorded  Have You Been Recently Discharged From Any Office Practice or Programs? No  Explanation of Discharge From Practice/Program: No data recorded    CCA Screening Triage Referral Assessment Type of Contact: Face-to-Face  Telemedicine Service Delivery:   Is this Initial or Reassessment? No data recorded Date Telepsych consult ordered in [facility]:  No data recorded Time Telepsych consult ordered in [facility]:  No data recorded Location of Assessment: [facility]  Provider Location: [facility] Assessment Services   Collateral Involvement: Pts mother:  Janet Schneider [phone] .  Pts mother reports that today pts son's father reached out to Amy with concerns about Janet. Schneider sent him text messages alluding suicidal ideation.  Taviona also sent her sister alarming texts containing pts thoughts about suicidal ideation. Amy reports that Janet Schneider stated to her "I think I need to be in the hospital" and that "I don't think I need to be around [place] (pts 42 yo son) I'm scared".  Amy reports that she is concerned that Janet Schneider could do something. Amy reports that Janet Schneider stated to her "I don't know what I might do".   Does Patient Have a Stage manager Guardian? No data recorded Name and Contact of Legal Guardian: No data recorded If Minor and Not Living with Parent(s), Who has Custody? No data recorded Is CPS involved or ever been involved?  Never  Is APS involved or ever been involved? Never   Patient Determined To Be At Risk for Harm To Self or Others Based on Review of Patient Reported Information or Presenting Complaint? Yes, for Self-Harm (thoughts with no plans or intent)  Method: No data recorded Availability of Means: No data recorded Intent: No data recorded Notification Required: No data recorded Additional Information for Danger to Others Potential: No data recorded Additional Comments for Danger to Others Potential: No data recorded Are There Guns or Other Weapons in Your Home? No data recorded Types of Guns/Weapons: No data recorded Are These Weapons Safely Secured?                            No data recorded Who Could Verify You Are Able To Have These Secured: No data recorded Do You Have any  Outstanding Charges, Pending Court Dates, Parole/Probation? No data recorded Contacted To Inform of Risk of Harm To Self or Others: No data recorded   Does Patient Present under Involuntary Commitment? No  IVC Papers Initial File Date: No data recorded  [place] of Residence: [place]   Patient Currently Receiving the Following Services: Not Receiving Services   Determination of Need: Urgent (48 hours)   Options For Referral: [facility]; Outpatient Therapy; Medication Management; Inpatient Hospitalization (overnight observation [place])  CCA Biopsychosocial Patient Reported Schizophrenia/Schizoaffective Diagnosis in Past: No   Strengths: pt reached out for help   Mental Health Symptoms Depression:   Change in energy/activity; Difficulty Concentrating; Hopelessness; Irritability; Tearfulness; Fatigue; Increase/decrease in appetite; Worthlessness;    Duration of Depressive symptoms:  Duration of Depressive Symptoms: Greater than two weeks   Mania:   Racing thoughts   Anxiety:    Worrying; Restlessness   Psychosis:   None   Duration of Psychotic symptoms:    Trauma:   None   Obsessions:    None   Compulsions:   None   Inattention:   None   Hyperactivity/Impulsivity:   None   Oppositional/Defiant Behaviors:  No data recorded  Emotional Irregularity:  No data recorded  Other Mood/Personality Symptoms:  No data recorded   Mental Status Exam Appearance and self-care  Stature:   Average   Weight:   Average weight   Clothing:   Casual   Grooming:   Normal   Cosmetic use:   None   Posture/gait:   Normal   Motor activity:   Slowed   Sensorium  Attention:   Inattentive   Concentration:   Normal   Orientation:   X5   Recall/memory:   Normal   Affect and Mood  Affect:   Depressed; Flat   Mood:   Depressed   Relating  Eye contact:   Staring   Facial expression:   Depressed; Sad   Attitude toward examiner:   Cooperative   Thought and Language  Speech flow:  Soft; Slow   Thought content:   Appropriate to Mood and Circumstances   Preoccupation:   None   Hallucinations:   None   Organization:  No data recorded  [employer] of Knowledge:   Good   Intelligence:   Average   Abstraction:   Functional   Judgement:   Impaired   Reality Testing:   Variable   Insight:   Gaps   Decision Making:   Impulsive   Social Functioning  Social Maturity:   Impulsive   Social Judgement:   Normal   Stress  Stressors:   Grief/losses; Relationship   Coping Ability:   Overwhelmed; Exhausted   Skill Deficits:  No data recorded  Supports:   Family     Religion:  N/a  Leisure/Recreation:  N/a  Exercise/Diet: Exercise/Diet Do You Exercise?: Yes Have You Gained or Lost A Significant Amount of Weight in the Past Six Months?: No Do You Follow a Special Diet?: No Do You Have Any Trouble Sleeping?: Yes   CCA Employment/Education Employment/Work Situation: Employment / Work Situation Employment Situation: Employed Work Stressors: none--pt enjoys her job Patient's Job has Been Impacted by  Current Illness: No Has Patient ever Been in Passenger transport manager?: No  Education: Education Is Patient Currently Attending School?: No Did You Nutritional therapist?: Yes What Type of College Degree Do you Have?: massage therapy Did You Have An Individualized Education Program (IIEP): No Did You Have Any Difficulty At [employer]?:  No Patient's Education Has Been Impacted by Current Illness: No   CCA Family/Childhood History Family and Relationship History: Family history Does patient have children?: Yes How many children?: 1 How is patient's relationship with their children?: 80 yo son  Childhood History:  Childhood History By whom was/is the patient raised?: Mother Did patient suffer any verbal/emotional/physical/sexual abuse as a child?: No Did patient suffer from severe childhood neglect?: No Has patient ever been sexually abused/assaulted/raped as an adolescent or adult?: No Was the patient ever a victim of a crime or a disaster?: No Witnessed domestic violence?: No Has patient been affected by domestic violence as an adult?: No  Child/Adolescent Assessment:   N/a  CCA Substance Use Alcohol/Drug Use: Alcohol / Drug Use Pain Medications: see MAR Prescriptions: see MAR Over the Counter: see MAR History of alcohol / drug use?: No history of alcohol / drug abuse     ASAM's:  Six Dimensions of Multidimensional Assessment  Dimension 1:  Acute Intoxication and/or Withdrawal Potential:      Dimension 2:  Biomedical Conditions and Complications:      Dimension 3:  Emotional, Behavioral, or Cognitive Conditions and Complications:     Dimension 4:  Readiness to Change:     Dimension 5:  Relapse, Continued use, or Continued Problem Potential:     Dimension 6:  Recovery/Living Environment:     ASAM Severity Score:    ASAM Recommended Level of Treatment:     Substance use Disorder (SUD)  none  Recommendations for Services/Supports/Treatments: Recommendations for  Services/Supports/Treatments Recommendations For Services/Supports/Treatments: Other (Comment), Medication Management, Individual Therapy ([place] overnight obs)  Discharge Disposition:  Overnight observation  DSM5 Diagnoses: Patient Active Problem List   Diagnosis Date Noted   Indication for care in labor and delivery, antepartum 07/30/2015   NSVD (normal spontaneous vaginal delivery) 07/30/2015     Referrals to Alternative Service(s): Referred to Alternative Service(s):   Place:   Date:   Time:    Referred to Alternative Service(s):   Place:   Date:   Time:    Referred to Alternative Service(s):   Place:   Date:   Time:    Referred to Alternative Service(s):   Place:   Date:   Time:     Rachel Bo Danielys Madry, LCSW

## 2021-11-05 ENCOUNTER — Encounter (HOSPITAL_COMMUNITY): Payer: Self-pay | Admitting: Psychiatry

## 2021-11-05 ENCOUNTER — Inpatient Hospital Stay (HOSPITAL_COMMUNITY)
Admission: AD | Admit: 2021-11-05 | Discharge: 2021-11-08 | DRG: 882 | Disposition: A | Discharge status: Home / Self Care | Payer: BC Managed Care – PPO | Source: Intra-hospital | Attending: Emergency Medicine | Admitting: Emergency Medicine

## 2021-11-05 ENCOUNTER — Other Ambulatory Visit: Payer: Self-pay

## 2021-11-05 DIAGNOSIS — R45851 Suicidal ideations: Secondary | ICD-10-CM | POA: Diagnosis present

## 2021-11-05 DIAGNOSIS — F332 Major depressive disorder, recurrent severe without psychotic features: Secondary | ICD-10-CM | POA: Diagnosis present

## 2021-11-05 DIAGNOSIS — Z608 Other problems related to social environment: Secondary | ICD-10-CM | POA: Diagnosis present

## 2021-11-05 DIAGNOSIS — F4323 Adjustment disorder with mixed anxiety and depressed mood: Secondary | ICD-10-CM | POA: Diagnosis present

## 2021-11-05 DIAGNOSIS — F322 Major depressive disorder, single episode, severe without psychotic features: Secondary | ICD-10-CM | POA: Diagnosis not present

## 2021-11-05 DIAGNOSIS — Z62811 Personal history of psychological abuse in childhood: Secondary | ICD-10-CM | POA: Diagnosis present

## 2021-11-05 LAB — CBC WITH DIFFERENTIAL/PLATELET
Abs Immature Granulocytes: 0.03 10*3/uL (ref 0.00–0.07)
Basophils Absolute: 0.1 10*3/uL (ref 0.0–0.1)
Basophils Relative: 1 %
Eosinophils Absolute: 0.1 10*3/uL (ref 0.0–0.5)
Eosinophils Relative: 1 %
HCT: 43.4 % (ref 36.0–46.0)
Hemoglobin: 14.4 g/dL (ref 12.0–15.0)
Immature Granulocytes: 0 %
Lymphocytes Relative: 22 %
Lymphs Abs: 1.8 10*3/uL (ref 0.7–4.0)
MCH: 31.5 pg (ref 26.0–34.0)
MCHC: 33.2 g/dL (ref 30.0–36.0)
MCV: 95 fL (ref 80.0–100.0)
Monocytes Absolute: 0.5 10*3/uL (ref 0.1–1.0)
Monocytes Relative: 6 %
Neutro Abs: 5.7 10*3/uL (ref 1.7–7.7)
Neutrophils Relative %: 70 %
Platelets: 388 10*3/uL (ref 150–400)
RBC: 4.57 MIL/uL (ref 3.87–5.11)
RDW: 11.9 % (ref 11.5–15.5)
WBC: 8.1 10*3/uL (ref 4.0–10.5)
nRBC: 0 % (ref 0.0–0.2)

## 2021-11-05 LAB — LIPID PANEL
Cholesterol: 171 mg/dL (ref 0–200)
HDL: 39 mg/dL — ABNORMAL LOW (ref >40)
LDL Cholesterol: 123 mg/dL — ABNORMAL HIGH (ref 0–99)
Total CHOL/HDL Ratio: 4.4 RATIO
Triglycerides: 44 mg/dL (ref <150)
VLDL: 9 mg/dL (ref 0–40)

## 2021-11-05 LAB — COMPREHENSIVE METABOLIC PANEL
ALT: 21 U/L (ref 0–44)
AST: 19 U/L (ref 15–41)
Albumin: 4.2 g/dL (ref 3.5–5.0)
Alkaline Phosphatase: 65 U/L (ref 38–126)
Anion gap: 6 (ref 5–15)
BUN: 8 mg/dL (ref 6–20)
CO2: 28 mmol/L (ref 22–32)
Calcium: 9.6 mg/dL (ref 8.9–10.3)
Chloride: 103 mmol/L (ref 98–111)
Creatinine, Ser: 0.63 mg/dL (ref 0.44–1.00)
GFR, Estimated: >60 mL/min (ref >60)
Glucose, Bld: 86 mg/dL (ref 70–99)
Potassium: 3.8 mmol/L (ref 3.5–5.1)
Sodium: 137 mmol/L (ref 135–145)
Total Bilirubin: 0.3 mg/dL (ref 0.3–1.2)
Total Protein: 7.1 g/dL (ref 6.5–8.1)

## 2021-11-05 LAB — RESP PANEL BY RT-PCR (FLU A&B, COVID) ARPGX2
Influenza A by PCR: NEGATIVE
Influenza B by PCR: NEGATIVE
SARS Coronavirus 2 by RT PCR: NEGATIVE

## 2021-11-05 LAB — HEMOGLOBIN A1C
Hgb A1c MFr Bld: 4.9 % (ref 4.8–5.6)
Mean Plasma Glucose: 93.93 mg/dL

## 2021-11-05 LAB — TSH: TSH: 2.019 u[IU]/mL (ref 0.350–4.500)

## 2021-11-05 LAB — ETHANOL: Alcohol, Ethyl (B): <10 mg/dL (ref <10)

## 2021-11-05 LAB — POCT PREGNANCY, URINE: Preg Test, Ur: NEGATIVE

## 2021-11-05 MED ORDER — ACETAMINOPHEN 325 MG PO TABS: 650.0000 mg | ORAL_TABLET | Freq: Four times a day (QID) | ORAL | Status: DC | PRN

## 2021-11-05 MED ORDER — HYDROXYZINE HCL 25 MG PO TABS
25.0000 mg | ORAL_TABLET | Freq: Three times a day (TID) | ORAL | Status: DC | PRN
  Administered 2021-11-05 – 2021-11-06 (×2): 25 mg via ORAL
  Filled 2021-11-05 (×3): qty 1

## 2021-11-05 MED ORDER — ALUM & MAG HYDROXIDE-SIMETH 200-200-20 MG/5ML PO SUSP: 30.0000 mL | ORAL | Status: DC | PRN

## 2021-11-05 MED ORDER — TRAZODONE HCL 50 MG PO TABS
50.0000 mg | ORAL_TABLET | Freq: Every evening | ORAL | Status: DC | PRN
  Administered 2021-11-05: 50 mg via ORAL
  Filled 2021-11-05: qty 1

## 2021-11-05 MED ORDER — MAGNESIUM HYDROXIDE 400 MG/5ML PO SUSP: 30.0000 mL | Freq: Every day | ORAL | Status: DC | PRN

## 2021-11-05 NOTE — Discharge Instructions (Signed)
Patient accepted to [facility]  

## 2021-11-05 NOTE — ED Notes (Signed)
Pt given breakfast.

## 2021-11-05 NOTE — Tx Team (Signed)
Initial Treatment Plan 11/05/2021 5:15 PM Janet Schneider [id number]    PATIENT STRESSORS: Financial difficulties   Marital or family conflict     PATIENT STRENGTHS: Ability for insight  Communication skills  Supportive family/friends    PATIENT IDENTIFIED PROBLEMS: Suicidal ideation  Depression  Ineffective coping skills                 DISCHARGE CRITERIA:  Ability to meet basic life and health needs Adequate post-discharge living arrangements Motivation to continue treatment in a less acute level of care  PRELIMINARY DISCHARGE PLAN: Attend aftercare/continuing care group Outpatient therapy Return to previous living arrangement  PATIENT/FAMILY INVOLVEMENT: This treatment plan has been presented to and reviewed with the patient, Janet Schneider, and/or family member.  The patient and family have been given the opportunity to ask questions and make suggestions.  Vela Prose, RN 11/05/2021, 5:15 PM

## 2021-11-05 NOTE — Progress Notes (Signed)
[facility] LCSW Progress Note   11/05/2021    9:40 AM  Marina Gravel   [id number]   Type of Contact and Topic:  Psychiatric Bed Placement     Patient information has been sent to [facility] via secure chat to review for potential admission. Patient has not yet been accepted at this time. Patient meets inpatient criteria per Liborio Nixon, NP.   Situation ongoing, CSW will continue to monitor and update note as more information becomes available.    Signed:  Corky Crafts, MSW, LCSWA, LCAS 11/05/2021 9:40 AM

## 2021-11-05 NOTE — Progress Notes (Signed)
Admission Note:Patient  is a 31 year old female who is admitted to the unit from [facility] for symptoms of depression and suicidal ideation.  Patient reports multiple stressors such as financial, relationship, parenting and job.  Patient is alert and oriented x 4.  Presents with a flat affect and depressed mood.  Patient forward little information during assessment.  Reports suicidal thoughts with no plan to follow through.  Per reports: patient boyfriend broke up with her today stating he doesn't want a step child.  Patient lives alone with her 49 year old son.  She's scared of being around her son.  Admission plan of care reviewed with consent signed.  Skin assessment and personal belongings completed.  Skin is dry and intact.  No contraband found.  Patient oriented to the unit, staff and room.  Routine safety checks initiated.  Verbalizes understanding of unit rules/protocols.  Patient is safe on the unit.

## 2021-11-05 NOTE — ED Notes (Signed)
Pt remains asleep pretty much since arriving to unit pt stable with no night tares or difficulty sleeping noted.

## 2021-11-05 NOTE — ED Provider Notes (Signed)
FBC/OBS ASAP Discharge Summary  Date and Time: 11/05/2021 10:13 AM  Name: Janet Schneider  MRN:  [id number]   Discharge Diagnoses:  Final diagnoses:  MDD (major depressive disorder), single episode, severe , no psychosis ([place])    Subjective: Today, patient reports having a lot of feeling surrounding her situation. She states, "I feel embarrassed to have someone break-up with me because they cannot see himself being a step-parent." She reports that it brings up all the feelings that she has been experiencing for years about not wanting to be a parent and how she regrets the choice that she made. She reports that for years she has had these thoughts of regret but has not done anything about it. She reports that she would never hurt her son but she often wishes that she could leave. She reports that she has had suicidal thoughts that come and go for years but she does not think that she could ever go through with it. She states that she often has feelings of not wanting to be alive. She denies homicidal ideations. She reports feeling depressed for a long time and describes her depressive symptoms as sadness, hopelessness, worthlessness, guilt, and crying spells. She reports good sleep. She reports a good appetite. She denies auditory and visual hallucinations. There is no objective evidence that she is responding to internal or external stimuli.  Stay Summary: Janet Schneider is a 31 year old female with no significant past psychiatric or medical history who presents to the [facility] behavioral health urgent care via [facility] as a voluntary walk-in for symptoms of worsening depression and SI, as well as concerns regarding her current status as a parent. Patient's mother called police due to concerns about the patient's mental health. Patient states that her and her boyfriend broke up earlier today on 11/04/2021 and that she has been feeling extremely depressed since this occurred. Patient was admitted to the [facility]  for continuous assessment and re-evaluated this morning. Labs obtained during admissions includes: CBC, CMP, EtOH, A1c, lipid panel, urine pregnancy, UDS, COVID, and TSH. Patient was started on Vistaril 25 mg po as needed for anxiety, trazodone 50 mg po nightly as needed for sleep. Patient has been observed on the unit without any disruptive, aggressive, psychotic, or self-harm behaviors. Patient currently endorsing thoughts of "not wanting to be alive" and based on collateral information obtained the patient is recommended for inpatient psychiatric treatment. Patient consents to inpatient treatment. Patient is voluntary at this time.   Per collateral on 11/04/21:  Pts mother:  Janet Schneider [phone] .  Pts mother reports that today pts son's father reached out to Janet Schneider with concerns about Janet Schneider. Janet Schneider sent him text messages alluding suicidal ideation.  Janet Schneider also sent her sister alarming texts containing pts thoughts about suicidal ideation. Janet Schneider reports that Janet Schneider stated to her "I think I need to be in the hospital" and that "I don't think I need to be around Janet Schneider (pts 24 yo son) I'm scared". Janet Schneider reports that she is concerned that Janet Schneider could do something. Janet Schneider reports that Janet Schneider stated to her "I don't know what I might do" Janet Schneider feels that Janet Schneider has had some symptoms on and off for over a year and has not had any outpatient psychiatric supports "she should have a long time ago".     Total Time spent with patient: 15 minutes  Past Psychiatric History: No significant past psych history Past Medical History:  Past Medical History:  Diagnosis Date   Headache  Hx of varicella     Past Surgical History:  Procedure Laterality Date   APPENDECTOMY     TONSILLECTOMY     with adenoids   Family History: History reviewed. No pertinent family history. Family Psychiatric History: No history reported. Social History:  Social History   Substance and Sexual Activity  Alcohol Use No     Social History    Substance and Sexual Activity  Drug Use No    Social History   Socioeconomic History   Marital status: Single    Spouse name: Not on file   Number of children: Not on file   Years of education: Not on file   Highest education level: Not on file  Occupational History   Not on file  Tobacco Use   Smoking status: Never   Smokeless tobacco: Never  Substance and Sexual Activity   Alcohol use: No   Drug use: No   Sexual activity: Yes  Other Topics Concern   Not on file  Social History Narrative   Not on file   Social Determinants of Health   Financial Resource Strain: Not on file  Food Insecurity: Not on file  Transportation Needs: Not on file  Physical Activity: Not on file  Stress: Not on file  Social Connections: Not on file   SDOH:  SDOH Screenings   Alcohol Screen: Not on file  Depression (PHQ2-9): Not on file  Financial Resource Strain: Not on file  Food Insecurity: Not on file  Housing: Not on file  Physical Activity: Not on file  Social Connections: Not on file  Stress: Not on file  Tobacco Use: Low Risk    Smoking Tobacco Use: Never   Smokeless Tobacco Use: Never   Passive Exposure: Not on file  Transportation Needs: Not on file    Tobacco Cessation:  A prescription for an FDA-approved tobacco cessation medication was offered at discharge and the patient refused  Current Medications:  Current Facility-Administered Medications  Medication Dose Route Frequency Provider Last Rate Last Admin   acetaminophen (TYLENOL) tablet 650 mg  650 mg Oral Q6H PRN Prescilla Sours, PA-C       alum & mag hydroxide-simeth (MAALOX/MYLANTA) 200-200-20 MG/5ML suspension 30 mL  30 mL Oral Q4H PRN Margorie John W, PA-C       hydrOXYzine (ATARAX) tablet 25 mg  25 mg Oral TID PRN Prescilla Sours, PA-C   25 mg at 11/05/21 0904   magnesium hydroxide (MILK OF MAGNESIA) suspension 30 mL  30 mL Oral Daily PRN Margorie John W, PA-C       traZODone (DESYREL) tablet 50 mg  50 mg Oral QHS  PRN Margorie John W, PA-C   50 mg at 11/04/21 2354   Current Outpatient Medications  Medication Sig Dispense Refill   ibuprofen (ADVIL,MOTRIN) 800 MG tablet Take 1 tablet (800 mg total) by mouth every 8 (eight) hours as needed. (Patient not taking: Reported on 11/04/2021) 45 tablet 1   oxyCODONE-acetaminophen (PERCOCET/ROXICET) 5-325 MG tablet Take 1-2 tablets by mouth every 6 (six) hours as needed for severe pain. (Patient not taking: Reported on 11/04/2021) 15 tablet 0   Prenatal Vit-Fe Fumarate-FA (MULTIVITAMIN-PRENATAL) 27-0.8 MG TABS tablet Take 1 tablet by mouth daily at 12 noon. (Patient not taking: Reported on 11/04/2021) 30 each 12    PTA Medications: (Not in a hospital admission)   Musculoskeletal  Strength & Muscle Tone: within normal limits Gait & Station: normal Patient leans: N/A  Psychiatric Specialty Exam  Presentation  General Appearance: Appropriate for Environment  Eye Contact:Fair  Speech:Clear and Coherent  Speech Volume:Decreased  Handedness:No data recorded  Mood and Affect  Mood:Depressed  Affect:Congruent   Thought Process  Thought Processes:Coherent  Descriptions of Associations:Intact  Orientation:Full (Time, Place and Person)  Thought Content:Logical  Diagnosis of Schizophrenia or Schizoaffective disorder in past: No    Hallucinations:Hallucinations: None  Ideas of Reference:None  Suicidal Thoughts:Suicidal Thoughts: Yes, Passive SI Passive Intent and/or Plan: Without Intent; Without Plan  Homicidal Thoughts:Homicidal Thoughts: No   Sensorium  Memory:Immediate Fair; Remote Fair; Recent Fair  Judgment:Fair  Insight:Fair   Executive Functions  Concentration:Fair  Attention Span:Fair  [place]   Psychomotor Activity  Psychomotor Activity:Psychomotor Activity: Normal   Assets  Assets:Communication Skills; Desire for Improvement; Financial Resources/Insurance; Housing; Leisure  Time; Physical Health; Social Support; Transport planner; Vocational/Educational   Sleep  Sleep:Sleep: Fair Number of Hours of Sleep: 7   Nutritional Assessment (For OBS and FBC admissions only) Has the patient had a weight loss or gain of 10 pounds or more in the last 3 months?: No Has the patient had a decrease in food intake/or appetite?: No Does the patient have dental problems?: No Does the patient have eating habits or behaviors that may be indicators of an eating disorder including binging or inducing vomiting?: No Has the patient recently lost weight without trying?: 0 Has the patient been eating poorly because of a decreased appetite?: 0 Malnutrition Screening Tool Score: 0    Physical Exam  Physical Exam Constitutional:      Appearance: Normal appearance.  HENT:     Head: Normocephalic and atraumatic.     Nose: Nose normal.  Eyes:     Conjunctiva/sclera: Conjunctivae normal.  Cardiovascular:     Rate and Rhythm: Normal rate.  Pulmonary:     Effort: Pulmonary effort is normal.  Musculoskeletal:        General: Normal range of motion.     Cervical back: Normal range of motion.  Neurological:     Mental Status: She is alert and oriented to person, place, and time.   Review of Systems  Constitutional: Negative.   HENT: Negative.    Eyes: Negative.   Respiratory: Negative.    Cardiovascular: Negative.   Gastrointestinal: Negative.   Genitourinary: Negative.   Musculoskeletal: Negative.   Skin: Negative.   Neurological: Negative.   Endo/Heme/Allergies: Negative.   Blood pressure 100/74, pulse 75, temperature 98.5 F (36.9 C), temperature source Oral, resp. rate 14, SpO2 97 %, unknown if currently breastfeeding. There is no height or weight on file to calculate BMI.   Plan Of Care/Follow-up recommendations:  Activity:  as tolerated.  Patient is recommended for inpatient psychiatric treatment. Patient is voluntary.  Disposition: Patient accepted to [facility] by  Linsey, AC., bed 304-1.   Raylan Hanton L, NP 11/05/2021, 10:13 AM

## 2021-11-05 NOTE — Progress Notes (Signed)
[facility] LCSW Progress Note   11/05/2021    12:57 PM  Marina Gravel   [id number]   Type of Contact and Topic:  Psychiatric Bed Placement   Pt accepted to [facility] rm 304-1  Patient meets inpatient criteria per Liborio Nixon, NP.   The attending provider will be Phineas Inches, MD  Call report to [phone]    Isaiah Serge, RN @ [facility] ED notified.     Pt scheduled  to arrive at [facility] at 1430-1500 PM  Signed:  Corky Crafts, MSW, LCSWA, LCAS 11/05/2021 12:59 PM

## 2021-11-05 NOTE — ED Notes (Signed)
Pt transported via safe transport to [facility].  Belongings returned from locker 5.  Safety maintained.

## 2021-11-05 NOTE — Progress Notes (Signed)
Adult Psychoeducational Group Note  Date:  11/05/2021 Time:  10:59 PM  Group Topic/Focus:  Wrap-Up Group:   The focus of this group is to help patients review their daily goal of treatment and discuss progress on daily workbooks.  Participation Level:  Active  Participation Quality:  Appropriate  Affect:  Appropriate  Cognitive:  Appropriate  Insight: Appropriate  Engagement in Group:  Engaged  Modes of Intervention:  Discussion  Additional Comments:  Pt stated her goal for today was to focus on her treatment plan. Pt stated she accomplished her goal today. Pt stated she did not talked with her doctor or her social worker about her care today. Pt rated her overall day a 6 out of 10. Pt stated she was able to contact her mother today which improved her overall day. Pt stated she felt better about herself tonight. Pt stated she was able to attend all meals. Pt stated she took all medications provided today. Pt stated her appetite was pretty good today. Pt rated her sleep last night was pretty good. Pt stated the goal tonight was to get some rest. Pt stated she had no physical pain tonight. Pt deny visual hallucinations and auditory issues tonight. Pt denies thoughts of harming herself or others. Pt stated she would alert staff if anything changed.  Candy Sledge 11/05/2021, 10:59 PM

## 2021-11-05 NOTE — Progress Notes (Signed)
°   11/05/21 2005  Psych Admission Type (Psych Patients Only)  Admission Status Voluntary  Psychosocial Assessment  Patient Complaints Self-harm thoughts;Anxiety;Depression  Eye Contact Fair  Facial Expression Flat  Affect Sad;Depressed  Speech Logical/coherent;Soft  Interaction Assertive  Motor Activity Other (Comment) (wnl)  Appearance/Hygiene Unremarkable  Behavior Characteristics Cooperative;Anxious  Mood Depressed;Anxious;Pleasant  Thought Process  Coherency WDL  Content WDL  Delusions None reported or observed  Perception WDL  Hallucination None reported or observed  Judgment WDL  Confusion None  Danger to Self  Current suicidal ideation? Passive  Self-Injurious Behavior No self-injurious ideation or behavior indicators observed or expressed   Agreement Not to Harm Self Yes  Description of Agreement verbal  Danger to Others  Danger to Others None reported or observed   Pt seen in the hallway. Pt endorses passive SI but contracts for safety. Pt denies HI, AVH and pain. Pt rates both anxiety and depression 6/10. States that this is her first time inpatient. Emotional support given and reassured pt that she can ask any question she needs answered. Pt wants something for sleep tonight. Offered pt PRN and explained its uses and possible side effects. No other concerns expressed.

## 2021-11-05 NOTE — ED Notes (Signed)
Pt provided breakfast and items to attend to ADL care.  No pain or discomfort reported/ noted.  Denies SI/ HI/ and AVH at this time.  Pt reported " I dont know how to feel about certain things.  I dont know what I need to do or if I need to talk to a therapist."  Encouraged pt to share these thoughts with provider during AM rounding.  Will continue to monitor for safety.

## 2021-11-06 DIAGNOSIS — F4323 Adjustment disorder with mixed anxiety and depressed mood: Secondary | ICD-10-CM | POA: Diagnosis present

## 2021-11-06 MED ORDER — TRAZODONE HCL 50 MG PO TABS
50.0000 mg | ORAL_TABLET | Freq: Every day | ORAL | Status: DC
  Filled 2021-11-06 (×4): qty 1
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
## 2021-11-06 NOTE — BHH Group Notes (Signed)
Goals Group 11/06/21    Group Focus: affirmation, clarity of thought, and goals/reality orientation Treatment Modality:  Psychoeducation Interventions utilized were assignment, group exercise, and support Purpose: To be able to understand and verbalize the reason for their admission to the hospital. To understand that the medication helps with their chemical imbalance but they also need to work on their choices in life. To be challenged to develop a list of 30 positives about themselves. Also introduce the concept that "feelings" are not reality.  Participation Level:  Did not attend   Janet Schneider

## 2021-11-06 NOTE — Progress Notes (Signed)
Pt denies SI/HI/AVH and verbally agrees to approach staff if these become apparent or before harming themselves/others. Rates depression 8/10. Rates anxiety 8/10. Rates pain 0/10. Pt has been interacting with others but is minimal. Scheduled medications administered to pt, per MD orders. RN provided support and encouragement to pt. Q15 min safety checks implemented and continued. Pt safe on the unit. RN will continue to monitor and intervene as needed.   11/06/21 0915  Psych Admission Type (Psych Patients Only)  Admission Status Voluntary  Psychosocial Assessment  Patient Complaints Depression;Anxiety  Eye Contact Fair  Facial Expression Flat;Sad  Affect Depressed;Anxious;Sad  Speech Logical/coherent;Soft  Interaction Forwards little;Minimal  Motor Activity Other (Comment) (WDL)  Appearance/Hygiene Unremarkable  Behavior Characteristics Cooperative;Anxious  Mood Depressed;Anxious;Sad;Pleasant  Aggressive Behavior  Effect No apparent injury  Thought Process  Coherency WDL  Content WDL  Delusions None reported or observed  Perception WDL  Hallucination None reported or observed  Judgment WDL  Confusion None  Danger to Self  Current suicidal ideation? Denies  Self-Injurious Behavior No self-injurious ideation or behavior indicators observed or expressed   Agreement Not to Harm Self Yes  Description of Agreement verbal  Danger to Others  Danger to Others None reported or observed

## 2021-11-06 NOTE — Progress Notes (Signed)
°   11/06/21 2318  Psych Admission Type (Psych Patients Only)  Admission Status Voluntary  Psychosocial Assessment  Patient Complaints Depression  Eye Contact Brief  Facial Expression Flat  Affect Appropriate to circumstance  Speech Logical/coherent  Interaction Minimal  Motor Activity Other (Comment) (WDL)  Appearance/Hygiene Unremarkable  Behavior Characteristics Appropriate to situation  Mood Depressed;Pleasant  Thought Process  Coherency WDL  Content WDL  Delusions None reported or observed  Perception WDL  Hallucination None reported or observed  Judgment WDL  Confusion None  Danger to Self  Current suicidal ideation? Denies  Self-Injurious Behavior No self-injurious ideation or behavior indicators observed or expressed   Agreement Not to Harm Self Yes  Description of Agreement verbal  Danger to Others  Danger to Others None reported or observed

## 2021-11-06 NOTE — BHH Group Notes (Signed)
Psychoeducational Group Note    Date:11/06/21 Time: 1300-1400    Purpose of Group: . The group focus' on teaching patients on how to identify their needs and their Life Skills:  A group where two lists are made. What people need and what are things that we do that are unhealthy. The lists are developed by the patients and it is explained that we often do the actions that are not healthy to get our list of needs met.  Goal:: to develop the coping skills needed to get their needs met  Participation Level:  did not attend  Paulino Rily

## 2021-11-06 NOTE — BHH Group Notes (Signed)
[facility] Group Notes:  (Nursing/MHT/Case Management/Adjunct)  Date:  11/06/2021  Time:  9:57 PM  Type of Therapy:   Wrap-up  Participation Level:  Active  Participation Quality:  Appropriate and Attentive  Affect:  Appropriate  Cognitive:  Alert and Appropriate  Insight:  Appropriate, Good, and Improving  Engagement in Group:  Developing/Improving  Modes of Intervention:  Discussion  Summary of Progress/Problems: CT attended group and was active. CT spoke about working on herself and wanting to make sure she is okay to go home.   Maxine Glenn 11/06/2021, 9:57 PM

## 2021-11-06 NOTE — Group Note (Signed)
Date:  11/06/2021 Time:  9:37 AM  Group Topic/Focus:  Orientation:   The focus of this group is to educate the patient on the purpose and policies of crisis stabilization and provide a format to answer questions about their admission.  The group details unit policies and expectations of patients while admitted.    Participation Level:  Did Not Attend  Participation Quality:    Affect:    Cognitive:    Insight:   Engagement in Group:    Modes of Intervention:    Additional Comments:  Pt did not attend group.  Jaquita Rector 11/06/2021, 9:37 AM
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
## 2021-11-06 NOTE — Group Note (Signed)
LCSW Group Therapy Note  11/06/2021   10:00-11:00am   Type of Therapy and Topic:  Group Therapy: Anger Cues and Responses  Participation Level:  None   Description of Group:   In this group, patients learned how to recognize the physical, cognitive, emotional, and behavioral responses they have to anger-provoking situations.  They identified a recent time they became angry and how they reacted.  They analyzed how their reaction was possibly beneficial and how it was possibly unhelpful.  The group discussed a variety of healthier coping skills that could help with such a situation in the future.  Focus was placed on how helpful it is to recognize the underlying emotions to our anger, because working on those can lead to a more permanent solution as well as our ability to focus on the important rather than the urgent.  Therapeutic Goals: Patients will remember their last incident of anger and how they felt emotionally and physically, what their thoughts were at the time, and how they behaved. Patients will identify how their behavior at that time worked for them, as well as how it worked against them. Patients will explore possible new behaviors to use in future anger situations. Patients will learn that anger itself is normal and cannot be eliminated, and that healthier reactions can assist with resolving conflict rather than worsening situations.  Summary of Patient Progress:  The patient did not share what her most recent time of anger was and remained quiet throughout group.   Therapeutic Modalities:   Cognitive Behavioral Therapy    Veva Holes, Theresia Majors 11/06/2021  3:44 PM

## 2021-11-06 NOTE — Progress Notes (Signed)
Collateral contact made with patient's mother, Lynett Grimes at [phone], with patient's consent. She reports that the patient has experienced depression for years and that she made threats of suicide to her sister Seward Grater and her former partner Kipp Brood) on the day of admission. She says that she talks with her daughter 2-3x per month. She feels strongly that the patient needs medication and continued hospitalization. She is unable to elaborate on any more potential depressive signs and symptoms and requests that the patient's sister be contacted.   Carlyn Reichert, MD PGY-1

## 2021-11-07 NOTE — Plan of Care (Signed)
[facility] INPATIENT: Patient Collateral Information  Was not able to get in contact with [facility] sister Seward Grater) at [phone] for collateral informations and determining patient's baseline status.  A HIPAA compliant voicemail requesting a return call at [phone].   Will try again.  Signed: Princess Bruins, DO Psychiatry Resident, PGY-1 [facility] 11/07/2021, 6:34 PM

## 2021-11-07 NOTE — Hospital Course (Addendum)
Janet Schneider is a 31 y.o. female with no documented past psychiatric history who presented to [facility] via [facility] for depression and SI after recent break-up with her partner, now a(n) Voluntary admit to [facility] for management of depression. [place] LOS: 2 days  Brief Hospital Course: ***  Plan: ***    Major Events During Hospitalization: Agitation: *** CIWA: Ativan? *** Violent: Secondary gain: Complications:  Out Patient Provider: Please consider starting SSRI for patient's depression, during hospitalization refused medications and opted for outpatient therapy instead. ***

## 2021-11-07 NOTE — Progress Notes (Addendum)
D. Pt presents with a flat affect/ depressed mood. Pt rated her depression, hopelessness and anxiety all 2's today, and reported that her goal was to "figure out my plan for after being here. How to make everything work, and who can be my support?" Pt currently denies SI/HI and AVH . Pt observed in the milieu interacting well with peers and attending groups. A. Labs and vitals monitored. Pt supported emotionally and encouraged to express concerns and ask questions.   R. Pt remains safe with 15 minute checks. Will continue POC.    11/07/21 1700  Psych Admission Type (Psych Patients Only)  Admission Status Voluntary  Psychosocial Assessment  Patient Complaints Anxiety;Depression  Eye Contact Brief  Facial Expression Flat  Affect Appropriate to circumstance  Speech Logical/coherent  Interaction Minimal  Motor Activity Other (Comment) (WDL)  Appearance/Hygiene Unremarkable  Behavior Characteristics Cooperative;Appropriate to situation  Mood Depressed;Pleasant  Aggressive Behavior  Effect No apparent injury  Thought Process  Coherency WDL  Content WDL  Delusions None reported or observed  Perception WDL  Hallucination None reported or observed  Judgment WDL  Confusion None  Danger to Self  Current suicidal ideation? Denies  Self-Injurious Behavior No self-injurious ideation or behavior indicators observed or expressed   Agreement Not to Harm Self Yes  Description of Agreement verbal  Danger to Others  Danger to Others None reported or observed

## 2021-11-07 NOTE — [facility] Suicide Risk Assessment (Signed)
[place] INPATIENT:  Family/Significant Other Suicide Prevention Education  Suicide Prevention Education:  Education Completed; mom, [name] [phone] ,  (name of family member/significant other) has been identified by the patient as the family member/significant other with whom the patient will be residing, and identified as the person(s) who will aid the patient in the event of a mental health crisis (suicidal ideations/suicide attempt).    Please see collateral in separate note.  Mother does not think there is a gun in the home, but stated only the patient knows for sure.  She would be very concerned if there were, because of the presence of a 6yo child.  With written consent from the patient, the family member/significant other has been provided the following suicide prevention education, prior to the and/or following the discharge of the patient.  The suicide prevention education provided includes the following: Suicide risk factors Suicide prevention and interventions National Suicide Hotline telephone number [facility] assessment telephone number [facility] [place] and/or Residential Mobile Crisis Unit telephone number  Request made of family/significant other to: Remove weapons (e.g., guns, rifles, knives), all items previously/currently identified as safety concern.   Remove drugs/medications (over-the-counter, prescriptions, illicit drugs), all items previously/currently identified as a safety concern.  The family member/significant other verbalizes understanding of the suicide prevention education information provided.  The family member/significant other agrees to remove the items of safety concern listed above.  Berlin Hun Grossman-Orr 11/07/2021, 4:40 PM
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
## 2021-11-07 NOTE — Progress Notes (Addendum)
[facility] MD Progress Note  11/07/2021 2:09 PM Janet Schneider  MRN: [id number]  CC: SI  Subjective: Janet Schneider is a 31 y.o. female with no documented past psychiatric history who presented to [facility] via [facility] for depression and SI after recent break-up with her partner, now a(n) Voluntary admit to [facility] for management of depression.  [facility] Day 2   Overnight Events: No acute events overnight or behavioral issues noted in chart. Patient has no scheduled meds aside from trazodone. Attended group therapies appropriately. PRNs: Hydroxyzine Patient slept 6 hours.  Interim History: Janet Schneider was evaluated today with attending Dr. Mason Jim. Janet Schneider initially was seen asleep in bed during morning group therapy, but was pleasant and cooperative with evaluation. She states she feels "okay" today and described her mood as " anxious due to everything that is happening". Her sleep was " poor because I was awake a lot last night because uncomfortable of uncomfortable positions", and appetite was okay.   She continues to deny the need for an antidepressant, and states that she still wants to try to work with outpatient therapy to manage her residual mood issues. She specifically wants PHP once discharged.  Patient had the expectation that she was getting discharged today, but when discussed with her that this is not the case, she stated that "this was a big mistake for reaching out for help", "I am not a depressed person, I told you guys that I regret being a parent, and I just need help with coping mechanisms and with feelings of abandonment". Discussed with patient the phone conversation with her mother yesterday including concern that she may be minimizing her recent SI and depressive symptoms.  Patient stated that the SI was impulsive in the setting of stress after break-up. She denies current SI or HI and can contract for safety on the unit.  She states she does not feel that collateral from family  should dictate whether she has does remain in the hospital.  She states that she talked with her sister on the phone this morning and became upset when her sister commented that maybe she should not be around her son right now. Patient stated that she has been a great mother for the last 6 years and cares for him greatly. Discussed with patient the purpose of collateral information, patient gave consent to speak to her sister Seward Grater for additional information.  Patient provided sister's Seward Grater) phone number for collateral, [phone].  She was encouraged to talk with social work about any options for family medical leave or a leave of absence from work after discharge in order to attend a partial hospital program and was encouraged to reach out to her family for financial, emotional, and physical support after discharge while she transitions home. Advocating for herself and finding ways to not avoid seeking help were discussed.  She stated that she did not want to share her thoughts, which is why she did not go to group this morning.  She was encouraged to try to participate more in group therapy in order to benefit from programming in the milieu as part of her current treatment plan.  She initially thought that PHP was individual therapy, discussed with her that it is group therapy.  Initially she was hesitant, but still agrees with PHP after discharge.  Diagnosis: Principal Problem:   Adjustment disorder with mixed anxiety and depressed mood  Total Time spent with patient:  I personally spent 30 minutes on the unit in  direct patient care. The direct patient care time included face-to-face time with the patient, reviewing the patient's chart, communicating with other professionals, and coordinating care. Greater than 50% of this time was spent in counseling or coordinating care with the patient regarding goals of hospitalization, psycho-education, and discharge planning needs.   Past Psychiatric  History: See H&P  Past Medical History:  Past Medical History:  Diagnosis Date   Headache    Hx of varicella     Past Surgical History:  Procedure Laterality Date   APPENDECTOMY     TONSILLECTOMY     with adenoids   Family History: see H&P  Family Psychiatric  History: See H&P  Social History:  Social History   Substance and Sexual Activity  Alcohol Use No     Social History   Substance and Sexual Activity  Drug Use No    Social History   Socioeconomic History   Marital status: Single    Spouse name: Not on file   Number of children: Not on file   Years of education: Not on file   Highest education level: Not on file  Occupational History   Not on file  Tobacco Use   Smoking status: Never   Smokeless tobacco: Never  Vaping Use   Vaping Use: Never used  Substance and Sexual Activity   Alcohol use: No   Drug use: No   Sexual activity: Yes  Other Topics Concern   Not on file  Social History Narrative   Not on file   Social Determinants of Health   Financial Resource Strain: Not on file  Food Insecurity: Not on file  Transportation Needs: Not on file  Physical Activity: Not on file  Stress: Not on file  Social Connections: Not on file    Sleep: Fair  Appetite:  Good  Current Medications: Current Facility-Administered Medications  Medication Dose Route Frequency Provider Last Rate Last Admin   acetaminophen (TYLENOL) tablet 650 mg  650 mg Oral Q6H PRN White, Patrice L, NP       alum & mag hydroxide-simeth (MAALOX/MYLANTA) 200-200-20 MG/5ML suspension 30 mL  30 mL Oral Q4H PRN White, Patrice L, NP       hydrOXYzine (ATARAX) tablet 25 mg  25 mg Oral TID PRN White, Patrice L, NP   25 mg at 11/06/21 1250   magnesium hydroxide (MILK OF MAGNESIA) suspension 30 mL  30 mL Oral Daily PRN White, Patrice L, NP       traZODone (DESYREL) tablet 50 mg  50 mg Oral QHS Carlyn ReichertGabrielle, Nick, MD        Lab Results:  No results found for this or any previous visit  (from the past 48 hour(s)).  Blood Alcohol level:  Lab Results  Component Value Date   ETH <10 11/04/2021    Metabolic Disorder Labs: Lab Results  Component Value Date   HGBA1C 4.9 11/04/2021   MPG 93.93 11/04/2021   No results found for: PROLACTIN Lab Results  Component Value Date   CHOL 171 11/04/2021   TRIG 44 11/04/2021   HDL 39 (L) 11/04/2021   CHOLHDL 4.4 11/04/2021   VLDL 9 11/04/2021   LDLCALC 123 (H) 11/04/2021    Physical Findings: AIMS: Facial and Oral Movements Muscles of Facial Expression: None, normal Lips and Perioral Area: None, normal Jaw: None, normal Tongue: None, normal,Extremity Movements Upper (arms, wrists, hands, fingers): None, normal Lower (legs, knees, ankles, toes): None, normal, Trunk Movements Neck, shoulders, hips: None, normal, Overall Severity  Severity of abnormal movements (highest score from questions above): None, normal Incapacitation due to abnormal movements: None, normal Patient's awareness of abnormal movements (rate only patient's report): No Awareness, Dental Status Current problems with teeth and/or dentures?: No Does patient usually wear dentures?: No    Musculoskeletal: Strength & Muscle Tone: within normal limits Gait & Station: normal Patient leans: N/A  Psychiatric Specialty Exam: Presentation  General Appearance: Appropriate for Environment  Eye Contact: Good  Speech: Clear and Coherent  Speech Volume: Normal  Mood and Affect  Mood: Dysphoric and at time irritable  Affect: Irritable when discussing barrier to discharge; otherwise constricted  Thought Process  Thought Processes: Ruminative, superficially goal directed  Descriptions of Associations: Intact  Orientation: Full (Time, Place and Person)  Thought Content: Logical, Denies AVH, paranoia, delusions  History of Schizophrenia/Schizoaffective disorder: No  Hallucinations: Denied  Ideas of Reference: None  Suicidal Thoughts:  Denied  Homicidal Thoughts: Denied  Sensorium  Memory: Good  Judgment: Fair  Insight: Fair   Executive Functions  Concentration: Fair  Attention Span: Fair  Recall: Dudley Major of Knowledge: Good  Language: Good   Psychomotor Activity  Psychomotor Activity:Normal  Assets  Assets:Communication Skills; Desire for Improvement; Financial Resources/Insurance; Housing; Leisure Time; Physical Health; Social Support; Transportation; Vocational/Educational  Sleep  Total time unrecorded  Physical Exam Vitals and nursing note reviewed.  Constitutional:      General: She is awake. She is not in acute distress.    Appearance: She is not ill-appearing or diaphoretic.  HENT:     Head: Normocephalic.  Pulmonary:     Effort: Pulmonary effort is normal. No respiratory distress.  Neurological:     General: No focal deficit present.     Mental Status: She is alert.  Psychiatric:        Behavior: Behavior is cooperative.    Review of Systems  Respiratory:  Negative for shortness of breath.   Cardiovascular:  Negative for chest pain.  Gastrointestinal:  Negative for nausea and vomiting.  Neurological:  Negative for dizziness and headaches.   Temp:  [97.9 F (36.6 C)] 97.9 F (36.6 C) (01/08 0620) Pulse Rate:  [55-79] 79 (01/08 0622) Resp:  [18] 18 (01/08 0622) BP: (93-118)/(64-65) 93/65 (01/08 0622) SpO2:  [100 %] 100 % (01/08 0622) Body mass index is 30.51 kg/m. Treatment Assessment & Plan Summary: Principal Problem:   Adjustment disorder with mixed anxiety and depressed mood  Jaki Steptoe is a 31 y.o. female with no documented past psychiatric history who presented to [facility] via [facility] for depression and SI after recent break-up with her partner, now a(n) Voluntary admit to [facility] for management of depression and mood stability.  She continues to pursue therapy for treatment rather than medications, unclear if patient is minimizing her depression, as  her family appears to believe so.  We will require collateral from sister, Seward Grater [phone]). [facility] Day 2   PLAN: Adjustment disorder with mixed anxiety and depressed mood (r/o MDD) Trazodone 50 mg qHS for sleep We will obtain collateral from sister, Seward Grater [phone]) Encourage group therapy and engaging with the milieu Patient declines start of psychotropic medication at this time PHP/IOP recommended and she agrees to talk with SW about options  Safety, monitoring and disposition planning: Safety and Monitoring: Voluntary admission to inpatient psychiatric unit for safety, stabilization and treatment - discussed 72 hour request for leave form if she wishes to sign this, but advised that we need to continue safety planning  and discharge planning with ongoing observation for safety at this time Daily contact with patient to assess and evaluate symptoms and progress in treatment and medical management Patient's case to be discussed in multi-disciplinary team meeting Observation Level : q15 minute checks Vital signs:  q12 hours Precautions: suicide The patient was seen and evaluated on the unit. The patient's chart was reviewed and nursing notes were reviewed.  The patient's case was discussed in multidisciplinary team meeting.  Plan and medication risk/benefits/side effects were reviewed with patient who consented and was amenable. Short Term Goals: Ability to identify triggers associated with substance abuse/mental health issues will improve Long Term Goals: Improvement in symptoms so as ready for discharge Social work and case management to assist with discharge planning and identification of hospital follow-up needs prior to discharge. Discharge Concerns: Need to establish a safety plan; Medication compliance and effectiveness Discharge Goals: Return to safe living environement with outpatient referrals for mental health follow-up including medication management/psychotherapy I  certify that inpatient services furnished can reasonably be expected to improve the patient's condition.     Signed: Princess BruinsJulie Nguyen, DO Psychiatry Resident, PGY-1 [facility] 11/07/2021, 2:09 PM

## 2021-11-07 NOTE — Group Note (Signed)
°  [facility] LCSW Group Therapy Note  Date/Time:  11/07/2021 10:00AM-11:00AM  Type of Therapy and Topic:  Group Therapy:  Self-Care after Hospitalization  Participation Level:  Active   Description of Group This process group involved patients discussing how they plan to take care of themselves in a better manner when they get home from the hospital.  The group started with patients listing one healthy and one unhealthy way they took care of themselves prior to hospitalization.  A discussion ensued about the differences in healthy and unhealthy coping skills.  Group members shared ideas about making changes when they return home so that they can stay well and in recovery.  The white board was used to list ideas so that patients can continue to see these ideas throughout the day.  Therapeutic Goals Patient will identify and describe one healthy and one unhealthy coping technique used prior to hospitalization Patient will participate in generating ideas about healthy self-care options when they return to the community Patients will be supportive of one another and receive said support from others Patient will identify one healthy self-care activity to add to his/her post-hospitalization life that can help in recovery  Summary of Patient Progress:  The patient expressed that prior to hospitalization some healthy self-care activity she engaged in was going for walks and listening to [employer], while unhealthy self-care activity included ignoring how she was feeling.  Patient's participation in group was beneficial.   Patient identified continuing to listen to positive videos as another self-care activity to add in her pursuit of recovery post-discharge.   Therapeutic Modalities Brief Solution-Focused Therapy Motivational Interviewing Psychoeducation      Veva Holes, Theresia Majors 11/07/2021  2:07 PM

## 2021-11-07 NOTE — [facility] Suicide Risk Assessment (Signed)
[facility] INPATIENT:  Family/Significant Other Suicide Prevention Education  Suicide Prevention Education:  Contact Attempts: mom, Amy Laural Benes [phone], (name of family member/significant other) has been identified by the patient as the family member/significant other with whom the patient will be residing, and identified as the person(s) who will aid the patient in the event of a mental health crisis.  With written consent from the patient, two attempts were made to provide suicide prevention education, prior to and/or following the patient's discharge.  We were unsuccessful in providing suicide prevention education.  A suicide education pamphlet was given to the patient to share with family/significant other.  Date and time of first attempt:  11/07/2021  /  10:50 AM  HIPAA-compliant voicemail left asking for call back Date and time of second attempt:   To be done by CSW team  Lynnell Chad 11/07/2021, 10:50 AM
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
## 2021-11-07 NOTE — B[facility] Group Notes (Signed)
Adult Psychoeducational Group Not °Date:  11/07/2021 °Time:  0900-1045 °Group Topic/Focus: PROGRESSIVE RELAXATION. A group where deep breathing is taught and tensing and relaxation muscle groups is used. Imagery is used as well.  Pts are asked to imagine 3 pillars that hold them up when they are not able to hold themselves up. ° °Participation Level:  did not attend ° °Janet Schneider A ° °

## 2021-11-08 ENCOUNTER — Encounter (HOSPITAL_COMMUNITY): Payer: Self-pay

## 2021-11-08 DIAGNOSIS — F332 Major depressive disorder, recurrent severe without psychotic features: Secondary | ICD-10-CM

## 2021-11-08 MED ORDER — TRAZODONE HCL 50 MG PO TABS: 50.0000 mg | ORAL_TABLET | Freq: Every evening | ORAL | Status: DC | PRN

## 2021-11-08 NOTE — Progress Notes (Signed)
Discharge Note:  Patient discharged home via Safe Transport.  Suicide prevention information given and discussed with patient who stated she understood and had no questions. Patient denied SI and HI.  Denied A/V hallucinations.  Patient stated she received all her belongings, etc.  Patient stated she appreciated all assistance received from [facility] staff.  All required discharge information given.

## 2021-11-08 NOTE — Group Note (Signed)
Recreation Therapy Group Note   Group Topic:Stress Management  Group Date: 11/08/2021 Start Time: 0930 End Time: 1000 Facilitators: Victorino Sparrow, LRT,CTRS Location: [address] Dayroom   Goal Area(s) Addresses:  Patient will actively participate in stress management techniques presented during session.  Patient will successfully identify benefit of practicing stress management post d/c.   Group Description: Guided Imagery. LRT provided education, instruction, and demonstration on practice of visualization via guided imagery. Patient was asked to participate in the technique introduced during session. LRT debriefed including topics of mindfulness, stress management and specific scenarios each patient could use these techniques. Patients were given suggestions of ways to access scripts post d/c and encouraged to explore Youtube and other apps available on smartphones, tablets, and computers.   Affect/Mood: Appropriate   Participation Level: Active   Participation Quality: Independent   Behavior: Appropriate   Speech/Thought Process: Focused   Insight: Good   Judgement: Good   Modes of Intervention: Script   Patient Response to Interventions:  Engaged   Education Outcome:  Acknowledges education and In group clarification offered    Clinical Observations/Individualized Feedback: Pt attended and participated in group despite the numerous interruptions from staff.     Plan: Continue to engage patient in RT group sessions 2-3x/week.   Victorino Sparrow, LRT,CTRS 11/08/2021 11:56 AM

## 2021-11-08 NOTE — Plan of Care (Signed)
Nurse discussed anxiety, depression and coping skills with patient.  

## 2021-11-08 NOTE — BH IP Treatment Plan (Signed)
Interdisciplinary Treatment and Diagnostic Plan Update  11/08/2021 Time of Session: 10:20am Janet Schneider MRN: [id number]  Principal Diagnosis: Adjustment disorder with mixed anxiety and depressed mood  Secondary Diagnoses: Principal Problem:   Adjustment disorder with mixed anxiety and depressed mood   Current Medications:  Current Facility-Administered Medications  Medication Dose Route Frequency Provider Last Rate Last Admin   acetaminophen (TYLENOL) tablet 650 mg  650 mg Oral Q6H PRN White, Patrice L, NP       alum & mag hydroxide-simeth (MAALOX/MYLANTA) 200-200-20 MG/5ML suspension 30 mL  30 mL Oral Q4H PRN White, Patrice L, NP       hydrOXYzine (ATARAX) tablet 25 mg  25 mg Oral TID PRN White, Patrice L, NP   25 mg at 11/06/21 1250   magnesium hydroxide (MILK OF MAGNESIA) suspension 30 mL  30 mL Oral Daily PRN White, Patrice L, NP       traZODone (DESYREL) tablet 50 mg  50 mg Oral QHS PRN Carlyn Reichert, MD       PTA Medications: No medications prior to admission.    Patient Stressors: Financial difficulties   Marital or family conflict    Patient Strengths: Ability for insight  Communication skills  Supportive family/friends   Treatment Modalities: Medication Management, Group therapy, Case management,  1 to 1 session with clinician, Psychoeducation, Recreational therapy.   Physician Treatment Plan for Primary Diagnosis: Adjustment disorder with mixed anxiety and depressed mood Long Term Goal(s): Improvement in symptoms so as ready for discharge   Short Term Goals: Ability to verbalize feelings will improve Ability to disclose and discuss suicidal ideas Ability to identify changes in lifestyle to reduce recurrence of condition will improve  Medication Management: Evaluate patient's response, side effects, and tolerance of medication regimen.  Therapeutic Interventions: 1 to 1 sessions, Unit Group sessions and Medication administration.  Evaluation of Outcomes:  Progressing  Physician Treatment Plan for Secondary Diagnosis: Principal Problem:   Adjustment disorder with mixed anxiety and depressed mood  Long Term Goal(s): Improvement in symptoms so as ready for discharge   Short Term Goals: Ability to verbalize feelings will improve Ability to disclose and discuss suicidal ideas Ability to identify changes in lifestyle to reduce recurrence of condition will improve     Medication Management: Evaluate patient's response, side effects, and tolerance of medication regimen.  Therapeutic Interventions: 1 to 1 sessions, Unit Group sessions and Medication administration.  Evaluation of Outcomes: Progressing   RN Treatment Plan for Primary Diagnosis: Adjustment disorder with mixed anxiety and depressed mood Long Term Goal(s): Knowledge of disease and therapeutic regimen to maintain health will improve  Short Term Goals: Ability to remain free from injury will improve, Ability to verbalize frustration and anger appropriately will improve, Ability to demonstrate self-control, Ability to participate in decision making will improve, Ability to identify and develop effective coping behaviors will improve, and Compliance with prescribed medications will improve  Medication Management: RN will administer medications as ordered by provider, will assess and evaluate patient's response and provide education to patient for prescribed medication. RN will report any adverse and/or side effects to prescribing provider.  Therapeutic Interventions: 1 on 1 counseling sessions, Psychoeducation, Medication administration, Evaluate responses to treatment, Monitor vital signs and CBGs as ordered, Perform/monitor CIWA, COWS, AIMS and Fall Risk screenings as ordered, Perform wound care treatments as ordered.  Evaluation of Outcomes: Progressing   LCSW Treatment Plan for Primary Diagnosis: Adjustment disorder with mixed anxiety and depressed mood Long Term Goal(s): Safe  transition to appropriate  next level of care at discharge, Engage patient in therapeutic group addressing interpersonal concerns.  Short Term Goals: Engage patient in aftercare planning with referrals and resources, Increase social support, Increase ability to appropriately verbalize feelings, Increase emotional regulation, Identify triggers associated with mental health/substance abuse issues, and Increase skills for wellness and recovery  Therapeutic Interventions: Assess for all discharge needs, 1 to 1 time with Social worker, Explore available resources and support systems, Assess for adequacy in community support network, Educate family and significant other(s) on suicide prevention, Complete Psychosocial Assessment, Interpersonal group therapy.  Evaluation of Outcomes: Progressing   Progress in Treatment: Attending groups: Yes. Participating in groups: Yes. Taking medication as prescribed: Yes. Toleration medication: Yes. Family/Significant other contact made: Yes, individual(s) contacted:  with mother Patient understands diagnosis: Yes. Discussing patient identified problems/goals with staff: Yes. Medical problems stabilized or resolved: Yes. Denies suicidal/homicidal ideation: Yes. Issues/concerns per patient self-inventory: No.   New problem(s) identified: No, Describe:  none  New Short Term/Long Term Goal(s): medication stabilization, elimination of SI thoughts, development of comprehensive mental wellness plan.    Patient Goals:  Did not attend  Discharge Plan or Barriers: Pt is being referred to [facility] services.   Reason for Continuation of Hospitalization: Anxiety Depression Medication stabilization  Estimated Length of Stay: 3-5 days   Scribe for Treatment Team: Otelia Santee, LCSW 11/08/2021 10:26 AM

## 2021-11-08 NOTE — Group Note (Signed)
LCSW Group Therapy Note   Group Date: 11/08/2021 Start Time: 1300 End Time: 64  LCSW Aftercare Discharge Planning Group Note  11/08/2021   Type of Group and Topic: Psychoeducational Group: Discharge Planning  Participation Level: Active  Description of Group: Discharge planning group reviews patients anticipated discharge plans and assists patients to anticipate and address any barriers to wellness/recovery in the community. Suicide prevention education is reviewed with patients in group.  Therapeutic Goals 1. Patients will state their anticipated discharge plan and mental health aftercare 2. Patients will identify potential barriers to wellness in the community setting 3. Patients will engage in problem solving, solution focused discussion of ways to anticipate and address barriers to wellness/recovery  Summary of Patient Progress: Janet Schneider shared during introductions that what makes her nervous about discharge is getting started on therapy and moving on. Janet Schneider was appropriate and participated during discussion.   Therapeutic Modalities: Motivational Interviewing   Janet Schneider, Janet Schneider 11/08/2021  1:57 PM

## 2021-11-08 NOTE — Progress Notes (Signed)
Called and made contact with patient's mother, Janet Schneider, at [phone]. She reports that she is unwilling to provide significant support for the patient to engage in a PHP program. She wants the patient to enroll in a residential mental health treatment center. She insists that the patient's insurance will cover this (despite this Clinical research associate expressing doubt as to that possibility). She reports wanting to provide more support if the patient will go to this kind of program. I agreed to present this option to the patient, but I also expressed that a PHP program is more reasonable and will likely yield significant benefit for the patient.   The patient declined to explore the residential possibility given that she "cannot start my life over after a 28 day program" and also that she does not feel her mother will be able to provide any financial assistance during this time.  Janet Reichert, MD PGY-1

## 2021-11-08 NOTE — [facility] Suicide Risk Assessment (Signed)
[facility] Discharge Suicide Risk Assessment   Principal Problem: Adjustment disorder with mixed anxiety and depressed mood Discharge Diagnoses: Principal Problem:   Adjustment disorder with mixed anxiety and depressed mood  Total Time Spent in Direct Patient Care:  I personally spent 50 minutes on the unit in direct patient care. The direct patient care time included face-to-face time with the patient, reviewing the patient's chart, communicating with other professionals, and coordinating care. Greater than 50% of this time was spent in counseling or coordinating care with the patient regarding goals of hospitalization, psycho-education, and discharge planning needs.  Subjective: Patient was seen on rounds with the house officer.  She denies SI, HI, AVH, paranoia, or delusions.  She voices no physical complaints.  She continues to decline start of a psychotropic medication but is interested in a partial hospital program and outpatient psychotherapy after discharge.  She is forward thinking and can articulate a safety and discharge plan.  She was made aware that she has a mildly elevated LDL cholesterol and low HDL and should have her lipids rechecked by her primary care provider and engage in a healthy diet and increase exercise after discharge.   House Officer contacted the patient's mother at patient's request to discuss discharge plans since the patient's sister could not be reached today. Mother reportedly feels patient would benefit from residential psychiatric treatment after discharge. This option was again discussed with the patient based on feedback from her mother. Patient declines residential option due to time and cost commitment and does not feel she can afford to take that much time from work or afford associated costs of a potential residential program. She instead wants to first pursue PHP/IOP as an outpatient option. SW to assist with PHP/IOP referral.  Musculoskeletal: Strength & Muscle  Tone: within normal limits Gait & Station: normal Patient leans: N/A  Psychiatric Specialty Exam  Presentation  General Appearance: Casually dressed, adequate hygiene  Eye Contact:Good  Speech:clear, coherent, normal rate  Speech Volume:Normal  Mood and Affect  Mood:anxious  Affect:Congruent  Thought Process  Thought Processes:Linear, goal directed  Descriptions of Associations:Intact  Orientation:Full (Time, Place and Person)  Thought Content:Logical - denies AVH, parnaoia or delusions - no acute psychosis on exam  History of Schizophrenia/Schizoaffective disorder:No  Hallucinations:Denied  Ideas of Reference:None  Suicidal Thoughts:Denied  Homicidal Thoughts:Denied  Sensorium  Memory:Good  Judgment:Intact  Insight:Intact  Executive Functions  Concentration:Good  Attention Span:Good  [place] of Knowledge:Good  Language:Good   Psychomotor Activity  Psychomotor Activity:Normal  Assets  Assets:Communication Skills; Desire for Improvement; Financial Resources/Insurance; Housing; Leisure Time; Physical Health; Social Support; Transportation; Vocational/Educational   Sleep  Total time unrecorded  Physical Exam Vitals reviewed.  Constitutional:      Appearance: Normal appearance.  HENT:     Head: Normocephalic and atraumatic.  Pulmonary:     Effort: Pulmonary effort is normal.  Neurological:     General: No focal deficit present.     Mental Status: She is alert.   Review of Systems  Respiratory:  Negative for shortness of breath.   Cardiovascular:  Negative for chest pain.  Gastrointestinal:  Negative for diarrhea, nausea and vomiting.  Blood pressure 97/71, pulse 79, temperature 97.6 F (36.4 C), resp. rate 16, height 5\' 6"  (1.676 m), weight 85.7 kg, SpO2 99 %, unknown if currently breastfeeding. Body mass index is 30.51 kg/m.  Mental Status Per Nursing Assessment::   On Admission:  Self-harm thoughts -  resolved  Demographic Factors:  Caucasian and young adult,  employed, mother  Loss Factors: Loss of significant relationship and Financial problems/change in socioeconomic status  Historical Factors: H/o prior trauma  Risk Reduction Factors:   Sense of responsibility to family, Employed, Positive social support, and Positive coping skills or problem solving skills, responsible for 6y/o child  Continued Clinical Symptoms:  Adjustment d/o diagnosis  Cognitive Features That Contribute To Risk:  None    Suicide Risk:  Minimal: No identifiable suicidal ideation. May be classified as minimal risk based on the severity of the depressive symptoms   Follow-up [facility].   Specialty: Behavioral Health Why: Assessment for Psych IOP is scheduled for ... Contact information: [place] [id number] mc [place] [facility].   Contact information: [address], [place], [place] [address] Phone: [phone] Main Phone:  [phone], Option 5 Fax: [phone]        [facility].   Contact information: [address] [place] [address] [phone]                 Plan Of Care/Follow-up recommendations:  Activity:  as tolerated Diet:  heart healthy Other:  Patient was advised to start the partial hospital program after discharge as scheduled and to engage in outpatient psychotherapy after discharge.  She was encouraged to reconsider antidepressant trial if her symptoms persist or worsen.  She was encouraged to abstain from illicit drug and alcohol use after discharge.  She has a mildly elevated LDL cholesterol and low HDL and is encouraged to follow-up with her primary care provider for recheck of her lipids after discharge without fail.Healthy diet and increased exercise were encouraged.  Harlow Asa, MD, FAPA 11/08/2021, 7:14 AM
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
## 2021-11-08 NOTE — Progress Notes (Signed)
°  [facility] Adult Case Management Discharge Plan :  Will you be returning to the same living situation after discharge:  Yes,  personal home At discharge, do you have transportation home?: No. CSW to arrange Safe Transport Do you have the ability to pay for your medications: Yes,  Private insurance  Release of information consent forms completed and in the chart;  Patient's signature needed at discharge.  Patient to Follow up at:  Follow-up Information     [facility] Follow up.   Why: You may also follow up for medication management and therapy services at this facility. Contact information: [address], [place], [place] [address] Phone: [phone] Main Phone:  [phone], Option 5 Fax: [phone]        BEHAVIORAL HEALTH PARTIAL HOSPITALIZATION PROGRAM Follow up.   Specialty: Behavioral Health Why: A referral has been made on your behalf for the partial hospitalization program which includes intensive therapy and medication management services. They will reach out to you to schedule a date for your intake. If you have any questions, please call [phone]. Contact information: [address] [phone] mc [place] [address] [phone]        [facility] [employer] Follow up.   Why: You may walk in to this facility Monday-Friday 8am-5pm for intake for medication management and therapy services. Contact information: [address] [place] [address] [phone]                 Next level of care provider has access to [facility] Link:no  Safety Planning and Suicide Prevention discussed: Yes,  mother     Has patient been referred to the Quitline?: N/A patient is not a smoker  Patient has been referred for addiction treatment: Pt. refused referral  Chrys Racer 11/08/2021, 2:24 PM

## 2021-11-08 NOTE — Progress Notes (Signed)
D:  Patient''s self inventory sheet, patient has fair sleep, no sleep medication.  Good appetite, normal energy level, good concentration.  Rated depression and hopeless 1, denied anxiety.  Denied withdrawals.  Denied SI.  Denied physical problems.  Denied physical pain.  Goal is work on discharge plans.  Discuss with SW during week and work on plan.  Does have discharge plans. A:  Medications administered per MD orders.  Support and encouragement given patient. R:  Denied SI and HI, contracts for safety.  Denied A/V hallucinations.  Safety maintained with 15 minute checks.

## 2021-11-09 ENCOUNTER — Telehealth (HOSPITAL_COMMUNITY): Payer: Self-pay | Admitting: Professional

## 2021-11-11 ENCOUNTER — Other Ambulatory Visit: Payer: Self-pay

## 2021-11-11 ENCOUNTER — Other Ambulatory Visit (HOSPITAL_COMMUNITY): Payer: BC Managed Care – PPO | Attending: Psychiatry | Admitting: Professional

## 2021-11-11 DIAGNOSIS — R454 Irritability and anger: Secondary | ICD-10-CM | POA: Insufficient documentation

## 2021-11-11 DIAGNOSIS — F329 Major depressive disorder, single episode, unspecified: Secondary | ICD-10-CM | POA: Insufficient documentation

## 2021-11-11 DIAGNOSIS — Z658 Other specified problems related to psychosocial circumstances: Secondary | ICD-10-CM | POA: Insufficient documentation

## 2021-11-11 DIAGNOSIS — F419 Anxiety disorder, unspecified: Secondary | ICD-10-CM | POA: Insufficient documentation

## 2021-11-11 DIAGNOSIS — R45851 Suicidal ideations: Secondary | ICD-10-CM | POA: Insufficient documentation

## 2021-11-11 DIAGNOSIS — F331 Major depressive disorder, recurrent, moderate: Secondary | ICD-10-CM | POA: Insufficient documentation

## 2021-11-11 DIAGNOSIS — F321 Major depressive disorder, single episode, moderate: Secondary | ICD-10-CM

## 2021-11-11 DIAGNOSIS — F4323 Adjustment disorder with mixed anxiety and depressed mood: Secondary | ICD-10-CM
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
## 2021-11-12 ENCOUNTER — Other Ambulatory Visit (HOSPITAL_COMMUNITY): Payer: BC Managed Care – PPO | Admitting: Licensed Clinical Social Worker

## 2021-11-12 ENCOUNTER — Other Ambulatory Visit (HOSPITAL_COMMUNITY): Payer: BC Managed Care – PPO

## 2021-11-12 ENCOUNTER — Other Ambulatory Visit: Payer: Self-pay

## 2021-11-12 DIAGNOSIS — F331 Major depressive disorder, recurrent, moderate: Secondary | ICD-10-CM | POA: Diagnosis not present

## 2021-11-12 DIAGNOSIS — Z658 Other specified problems related to psychosocial circumstances: Secondary | ICD-10-CM | POA: Diagnosis not present

## 2021-11-12 DIAGNOSIS — F419 Anxiety disorder, unspecified: Secondary | ICD-10-CM | POA: Diagnosis not present

## 2021-11-12 DIAGNOSIS — R454 Irritability and anger: Secondary | ICD-10-CM | POA: Diagnosis not present

## 2021-11-12 DIAGNOSIS — R45851 Suicidal ideations: Secondary | ICD-10-CM | POA: Diagnosis present
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
## 2021-11-12 NOTE — [facility] Group Notes (Signed)
Spiritual care group on grief and loss facilitated by chaplain Janne Napoleon, [facility]   Group Goal:   Support / Education around grief and loss   Members engage in facilitated group support and psycho-social education.   Group Description:   Following introductions and group rules, group members engaged in facilitated group dialog and support around topic of loss, with particular support around experiences of loss in their lives. Group Identified types of loss (relationships / self / things) and identified patterns, circumstances, and changes that precipitate losses. Reflected on thoughts / feelings around loss, normalized grief responses, and recognized variety in grief experience. Group noted Worden's four tasks of grief in discussion.   Group drew on Adlerian / Rogerian, narrative, MI,   Patient Progress: Janet Schneider attended group and actively participated in conversation.    [place], [facility] Pager, [phone] 11:45 AM

## 2021-11-14 ENCOUNTER — Encounter (HOSPITAL_COMMUNITY): Payer: Self-pay | Admitting: Family

## 2021-11-15 ENCOUNTER — Other Ambulatory Visit: Payer: Self-pay

## 2021-11-15 ENCOUNTER — Other Ambulatory Visit (HOSPITAL_COMMUNITY): Payer: BC Managed Care – PPO | Admitting: Licensed Clinical Social Worker

## 2021-11-15 ENCOUNTER — Other Ambulatory Visit (HOSPITAL_COMMUNITY): Payer: BC Managed Care – PPO

## 2021-11-15 DIAGNOSIS — F4323 Adjustment disorder with mixed anxiety and depressed mood: Secondary | ICD-10-CM

## 2021-11-15 DIAGNOSIS — F331 Major depressive disorder, recurrent, moderate: Secondary | ICD-10-CM

## 2021-11-16 ENCOUNTER — Telehealth (HOSPITAL_COMMUNITY): Payer: Self-pay

## 2021-11-16 ENCOUNTER — Other Ambulatory Visit (HOSPITAL_COMMUNITY): Payer: BC Managed Care – PPO

## 2021-11-16 NOTE — BH Assessment (Signed)
Care Management - [facility] Follow Up Discharges  ° °Patient has been placed in an inpatient psychiatric hospital ([facility]) on 11-05-21 °

## 2021-11-17 ENCOUNTER — Other Ambulatory Visit: Payer: Self-pay

## 2021-11-17 ENCOUNTER — Other Ambulatory Visit (HOSPITAL_COMMUNITY): Payer: BC Managed Care – PPO

## 2021-11-17 ENCOUNTER — Encounter (HOSPITAL_COMMUNITY): Payer: Self-pay | Admitting: Family

## 2021-11-17 NOTE — Progress Notes (Signed)
°  [facility] Behavioral Health Partial hospitalization outpatient Program Discharge Summary  Chelsei Mcchesney [id number]  Admission date:11/12/2021 Discharge date: 11/16/2021  Reason for admission: per admission assessment note: :Kataleya Zaugg is a 31 y.o. Caucasian female presents with depression and suicidal ideations.  Reports she was recently hospitalized due to worsening depression after a break-up.  Denied that she has been followed by therapy or psychiatry follow-up to her inpatient admission.  States struggling with depression symptoms on and off throughout her life.  She reports mood irritability, anxiety and symptoms of worry.  Denied that she was initiated on any psychotropic medication during her admission.  Declined initiating medications currently.  Interested in continuing with talk therapy.   Progress (rationale): Ongoing, patient attended and participated with 1 daily group session with active and engaged participation.  Per treatment team patient declined to continue with partial hospitalization at this time.  Patient to keep follow-up with individual therapist and psychiatry.  Support encouragement and reassurance was provided.   Take all medications as prescribed. Keep all follow-up appointments as scheduled.  Do not consume alcohol or use illegal drugs while on prescription medications. Report any adverse effects from your medications to your primary care provider promptly.  In the event of recurrent symptoms or worsening symptoms, call 911, a crisis hotline, or go to the nearest emergency department for evaluation.    Oneta Rack, NP 11/17/2021

## 2021-11-18 ENCOUNTER — Other Ambulatory Visit (HOSPITAL_COMMUNITY): Payer: BC Managed Care – PPO

## 2021-11-18 ENCOUNTER — Ambulatory Visit (HOSPITAL_COMMUNITY): Payer: BC Managed Care – PPO

## 2021-11-19 ENCOUNTER — Other Ambulatory Visit (HOSPITAL_COMMUNITY): Payer: BC Managed Care – PPO

## 2021-11-19 ENCOUNTER — Ambulatory Visit (HOSPITAL_COMMUNITY): Payer: BC Managed Care – PPO

## 2021-11-22 ENCOUNTER — Other Ambulatory Visit (HOSPITAL_COMMUNITY): Payer: BC Managed Care – PPO

## 2021-11-22 ENCOUNTER — Ambulatory Visit (HOSPITAL_COMMUNITY): Payer: BC Managed Care – PPO

## 2021-11-23 ENCOUNTER — Ambulatory Visit (HOSPITAL_COMMUNITY): Payer: BC Managed Care – PPO

## 2021-11-23 ENCOUNTER — Other Ambulatory Visit (HOSPITAL_COMMUNITY): Payer: BC Managed Care – PPO

## 2021-11-24 ENCOUNTER — Other Ambulatory Visit (HOSPITAL_COMMUNITY): Payer: BC Managed Care – PPO

## 2021-11-24 ENCOUNTER — Ambulatory Visit (HOSPITAL_COMMUNITY): Payer: BC Managed Care – PPO

## 2021-11-25 ENCOUNTER — Other Ambulatory Visit (HOSPITAL_COMMUNITY): Payer: BC Managed Care – PPO

## 2021-11-25 ENCOUNTER — Ambulatory Visit (HOSPITAL_COMMUNITY): Payer: BC Managed Care – PPO

## 2021-11-26 ENCOUNTER — Ambulatory Visit (HOSPITAL_COMMUNITY): Payer: BC Managed Care – PPO

## 2021-11-26 ENCOUNTER — Other Ambulatory Visit (HOSPITAL_COMMUNITY): Payer: BC Managed Care – PPO

## 2021-11-29 ENCOUNTER — Ambulatory Visit (HOSPITAL_COMMUNITY): Payer: BC Managed Care – PPO

## 2021-11-30 ENCOUNTER — Ambulatory Visit (HOSPITAL_COMMUNITY): Payer: BC Managed Care – PPO
